# Patient Record
Sex: Male | Born: 1980
Health system: Southern US, Community
[De-identification: ages and names within clinical notes are randomized; demographics above are authoritative.]

## PROBLEM LIST (undated history)

## (undated) DIAGNOSIS — Z789 Other specified health status: Secondary | ICD-10-CM

## (undated) HISTORY — DX: Other specified health status: Z78.9

## (undated) HISTORY — PX: OTHER SURGICAL HISTORY: SHX169

---

## 2010-06-07 ENCOUNTER — Emergency Department (HOSPITAL_COMMUNITY): Admission: EM | Admit: 2010-06-07 | Discharge: 2010-06-07 | Payer: Self-pay | Admitting: Emergency Medicine

## 2015-01-30 ENCOUNTER — Emergency Department (HOSPITAL_COMMUNITY)
Admission: EM | Admit: 2015-01-30 | Discharge: 2015-01-30 | Disposition: A | Discharge status: Home / Self Care | Payer: Self-pay | Attending: Emergency Medicine | Admitting: Emergency Medicine

## 2015-01-30 ENCOUNTER — Encounter (HOSPITAL_COMMUNITY): Payer: Self-pay | Admitting: Emergency Medicine

## 2015-01-30 DIAGNOSIS — G510 Bell's palsy: Secondary | ICD-10-CM | POA: Insufficient documentation

## 2015-01-30 MED ORDER — VALACYCLOVIR HCL 500 MG PO TABS: 500.0000 mg | ORAL_TABLET | Freq: Two times a day (BID) | ORAL | Status: DC

## 2015-01-30 MED ORDER — PREDNISONE 10 MG PO TABS: ORAL_TABLET | ORAL | Status: DC

## 2015-01-30 NOTE — ED Notes (Signed)
Per patient, woke up this am and was brushing teeth and noticed right facial droop

## 2015-01-30 NOTE — Discharge Instructions (Signed)
Bell's Palsy °Bell's palsy is a condition in which the muscles on one side of the face cannot move (paralysis). This is because the nerves in the face are paralyzed. It is most often thought to be caused by a virus. The virus causes swelling of the nerve that controls movement on one side of the face. The nerve travels through a tight space surrounded by bone. When the nerve swells, it can be compressed by the bone. This results in damage to the protective covering around the nerve. This damage interferes with how the nerve communicates with the muscles of the face. As a result, it can cause weakness or paralysis of the facial muscles.  °Injury (trauma), tumor, and surgery may cause Bell's palsy, but most of the time the cause is unknown. It is a relatively common condition. It starts suddenly (abrupt onset) with the paralysis usually ending within 2 days. Bell's palsy is not dangerous. But because the eye does not close properly, you may need care to keep the eye from getting dry. This can include splinting (to keep the eye shut) or moistening with artificial tears. Bell's palsy very seldom occurs on both sides of the face at the same time. °SYMPTOMS  °· Eyebrow sagging. °· Drooping of the eyelid and corner of the mouth. °· Inability to close one eye. °· Loss of taste on the front of the tongue. °· Sensitivity to loud noises. °TREATMENT  °The treatment is usually non-surgical. If the patient is seen within the first 24 to 48 hours, a short course of steroids may be prescribed, in an attempt to shorten the length of the condition. Antiviral medicines may also be used with the steroids, but it is unclear if they are helpful.  °You will need to protect your eye, if you cannot close it. The cornea (clear covering over your eye) will become dry and can be damaged. Artificial tears can be used to keep your eye moist. Glasses or an eye patch should be worn to protect your eye. °PROGNOSIS  °Recovery is variable, ranging  from days to months. Although the problem usually goes away completely (about 80% of cases resolve), predicting the outcome is impossible. Most people improve within 3 weeks of when the symptoms began. Improvement may continue for 3 to 6 months. A small number of people have moderate to severe weakness that is permanent.  °HOME CARE INSTRUCTIONS  °· If your caregiver prescribed medication to reduce swelling in the nerve, use as directed. Do not stop taking the medication unless directed by your caregiver. °· Use moisturizing eye drops as needed to prevent drying of your eye, as directed by your caregiver. °· Protect your eye, as directed by your caregiver. °· Use facial massage and exercises, as directed by your caregiver. °· Perform your normal activities, and get your normal rest. °SEEK IMMEDIATE MEDICAL CARE IF:  °· There is pain, redness or irritation in the eye. °· You or your child has an oral temperature above 102° F (38.9° C), not controlled by medicine. °MAKE SURE YOU:  °· Understand these instructions. °· Will watch your condition. °· Will get help right away if you are not doing well or get worse. °Document Released: 09/07/2005 Document Revised: 11/30/2011 Document Reviewed: 12/15/2013 °ExitCare® Patient Information ©2015 [employer]. This information is not intended to replace advice given to you by your health care provider. Make sure you discuss any questions you have with your health care provider. ° °

## 2015-01-30 NOTE — ED Provider Notes (Signed)
CSN: [id number]     Arrival date & time 01/30/15  1000 History   First MD Initiated Contact with Patient 01/30/15 1010     Chief Complaint  Patient presents with  . Facial Droop     (Consider location/radiation/quality/duration/timing/severity/associated sxs/prior Treatment) HPI Comments: Patient presents to the ER for evaluation of right-sided facial drooping. Patient reports that he noticed drooping of the right side of his face this morning when he woke up. He does not have a headache or any pain. No arm or leg numbness, tingling or weakness.   History reviewed. No pertinent past medical history. History reviewed. No pertinent past surgical history. No family history on file. History  Substance Use Topics  . Smoking status: Never Smoker   . Smokeless tobacco: Not on file  . Alcohol Use: No    Review of Systems  Neurological: Positive for weakness (right facial).      Allergies  Review of patient's allergies indicates no known allergies.  Home Medications   Prior to Admission medications   Medication Sig Start Date End Date Taking? Authorizing Provider  predniSONE (DELTASONE) 10 MG tablet Take 3 tablets daily for 5 days, then take 2.5 tablets for 1 day, then take 2 tablets for 1 day, then take 1.5 tablets for 1 day, then take 1 tablet for 1 day, then take 0.5 tablet for 1 day, then stop 01/30/15   Orpah Greek, MD  valACYclovir (VALTREX) 500 MG tablet Take 1 tablet (500 mg total) by mouth 2 (two) times daily. 01/30/15   Orpah Greek, MD   BP 142/76 mmHg  Pulse 78  Temp(Src) 98.2 F (36.8 C) (Oral)  Resp 18  SpO2 100% Physical Exam  Constitutional: He is oriented to person, place, and time. He appears well-developed and well-nourished. No distress.  HENT:  Head: Normocephalic and atraumatic.  Right Ear: Hearing normal.  Left Ear: Hearing normal.  Nose: Nose normal.  Mouth/Throat: Oropharynx is clear and moist and mucous membranes are normal.   Eyes: Conjunctivae and EOM are normal. Pupils are equal, round, and reactive to light.  Neck: Normal range of motion. Neck supple.  Cardiovascular: Regular rhythm, S1 normal and S2 normal.  Exam reveals no gallop and no friction rub.   No murmur heard. Pulmonary/Chest: Effort normal and breath sounds normal. No respiratory distress. He exhibits no tenderness.  Abdominal: Soft. Normal appearance and bowel sounds are normal. There is no hepatosplenomegaly. There is no tenderness. There is no rebound, no guarding, no tenderness at McBurney's point and negative Murphy's sign. No hernia.  Musculoskeletal: Normal range of motion.  Neurological: He is alert and oriented to person, place, and time. He has normal strength. A cranial nerve deficit (Right facial nerve palsy, including upper motor neuron) is present. No sensory deficit. Coordination normal. GCS eye subscore is 4. GCS verbal subscore is 5. GCS motor subscore is 6.  Extraocular muscle movement: normal No visual field cut Pupils: equal and reactive both direct and consensual response is normal No nystagmus present    Sensory function is intact to light touch, pinprick Proprioception intact  Grip strength 5/5 symmetric in upper extremities No pronator drift Normal finger to nose bilaterally  Lower extremity strength 5/5 against gravity Normal heel to shin bilaterally  Gait: normal   Skin: Skin is warm, dry and intact. No rash noted. No cyanosis.  Psychiatric: He has a normal mood and affect. His speech is normal and behavior is normal. Thought content normal.  Nursing note and  vitals reviewed.   ED Course  Procedures (including critical care time) Labs Review Labs Reviewed - No data to display  Imaging Review No results found.   EKG Interpretation None      MDM   Final diagnoses:  Bell palsy    Patient presents to the ER for evaluation of a facial droop. Patient awakened this morning with right facial droop.  Examination is consistent with a facial nerve palsy. This includes upper and lower motor neuron, no concern for acute stroke. Examination is very consistent with Bell's palsy. Remainder of neurologic examination was unremarkable. Patient treated empirically for Bell's palsy.    Orpah Greek, MD 01/30/15 1036

## 2015-03-19 ENCOUNTER — Ambulatory Visit: Payer: Worker's Compensation

## 2015-03-19 ENCOUNTER — Ambulatory Visit (INDEPENDENT_AMBULATORY_CARE_PROVIDER_SITE_OTHER): Payer: Worker's Compensation | Admitting: Family Medicine

## 2015-03-19 VITALS — BP 116/86 | HR 70 | Temp 98.1°F | Resp 16 | Ht 72.0 in | Wt 218.0 lb

## 2015-03-19 DIAGNOSIS — S63602A Unspecified sprain of left thumb, initial encounter: Secondary | ICD-10-CM | POA: Diagnosis not present

## 2015-03-19 DIAGNOSIS — M79642 Pain in left hand: Secondary | ICD-10-CM | POA: Diagnosis not present

## 2015-03-19 NOTE — Patient Instructions (Signed)
Take ibuprofen 800 mg three times a day. Keep splint on for next 1 week. Return in 7 days for follow up. Light duty at work.

## 2015-03-19 NOTE — Progress Notes (Signed)
Frank Sullivan 17-Feb-1981 34 y.o.  Chief Complaint  Patient presents with  . Hand Pain    Left hand    Date of Injury: 03/19/15  History of Present Illness:  Presents for evaluation of work-related complaint. Drives a fork lift for work. States he was reaching for the shifter and misjudged where he was reaching. His thumb got caught on something in the vehicle and hyperextended. He had immediate shooting pain down radial side of wrist. States now he only has pain with movement and palpation - no pain at rest. He is noticing some swelling at the base of his thumb. Hasn't taken anything for the pain - did apply ice. No prior left hand/wrist/arm injury. He denies numbness or weakness.  ROS See HPI  No Known Allergies   Current medications reviewed and updated. Past medical history, family history, social history have been reviewed and updated.  Physical Exam  Constitutional: He is oriented to person, place, and time and well-developed, well-nourished, and in no distress. Vital signs are normal.  HENT:  Head: Normocephalic and atraumatic.  Eyes: Conjunctivae and lids are normal. Right eye exhibits no discharge. Left eye exhibits no discharge. No scleral icterus.  Cardiovascular: Normal rate, regular rhythm and normal pulses.   Pulmonary/Chest: Effort normal and breath sounds normal. No respiratory distress.  Musculoskeletal:       Right wrist: Normal.       Left wrist: Normal.       Right hand: Normal.       Left hand: He exhibits decreased range of motion (decreased abduction of thumb, otherwise full ROM of thumb), tenderness (base of 1st metacarpal. mild scaphoid tenderness) and swelling (thenar eminence). He exhibits normal capillary refill. Normal sensation noted. Decreased strength noted. He exhibits thumb/finger opposition. He exhibits no wrist extension trouble.  Neurological: He is oriented to person, place, and time.  Skin: Skin is warm, dry and intact. No lesion and no rash  noted.  Psychiatric: Mood, memory, affect and judgment normal.    [facility] reading (PRIMARY) by  Dr. Lorelei Pont: negative  Assessment and Plan:  1. Thumb sprain, left, initial encounter 2. Hand pain, left Radiograph negative. Pt was fit for thumb spica splint since some scaphoid tenderness present. He will wear for next 7 days. Counseled on ibuprofen, rest, ice, elevation. Light duty at work. Return in 7 days for recheck. - DG Wrist Complete Left; Future   Benjaman Pott. Drenda Freeze, [facility]  03/19/2015

## 2015-03-28 ENCOUNTER — Ambulatory Visit (INDEPENDENT_AMBULATORY_CARE_PROVIDER_SITE_OTHER): Payer: Worker's Compensation | Admitting: Physician Assistant

## 2015-03-28 VITALS — BP 122/82 | HR 83 | Temp 98.2°F | Resp 16 | Ht 72.0 in | Wt 218.6 lb

## 2015-03-28 DIAGNOSIS — S63602D Unspecified sprain of left thumb, subsequent encounter: Secondary | ICD-10-CM

## 2015-03-28 NOTE — Patient Instructions (Signed)
Continue to wear splint at work. You do not have to wear splint at home. Do exercise at home daily. If your pain stops, you don't need to wear splint at work. Return on 04/11/15 for follow up - will determine at that time if you need hand physical therapy.

## 2015-03-28 NOTE — Progress Notes (Signed)
Frank Sullivan 22-Jun-1981 34 y.o.   Chief Complaint  Patient presents with  . Follow-up    left hand     Date of Injury: 03/19/15  History of Present Illness:  Presents for evaluation of work-related complaint.  This is a 34 year old male who was seen here for a thumb injury on 03/19/15. Thumb was hyperextended while reached for fork lift shifter. Radiograph was negative. He was placed in thumb spica splint d/t some scaphoid tenderness. Today he is reporting he has been wearing splint at all times except during showers and sleep. Pain has improved however he noticed certain positions of the thumb exacerbates the thumb, esp forced extension. Swelling has gone down. He denies weakness or paresthesias.  ROS  See HPI  No Known Allergies  Current medications reviewed and updated. Past medical history, family history, social history have been reviewed and updated.  Physical Exam  Constitutional: He is oriented to person, place, and time and well-developed, well-nourished, and in no distress. No distress.  HENT:  Head: Normocephalic and atraumatic.  Eyes: Conjunctivae are normal. Right eye exhibits no discharge. Left eye exhibits no discharge. No scleral icterus.  Cardiovascular: Normal rate, regular rhythm and intact distal pulses.   Pulmonary/Chest: Effort normal. No respiratory distress.  Musculoskeletal:       Right hand: Normal.       Left hand: He exhibits decreased range of motion (85% abduction, compared to right. pain with forced extension at proximal 1st metacarpal), tenderness (Along 1st metacarpal) and bony tenderness. He exhibits normal capillary refill, no deformity, no laceration and no swelling. Normal sensation noted. Normal strength noted.  Neurological: He is alert and oriented to person, place, and time.  Skin: Skin is warm, dry and intact. No lesion and no rash noted.  Psychiatric: Mood, memory, affect and judgment normal.  Nursing note and vitals  reviewed.  Assessment and Plan:  1. Thumb sprain, left, subsequent encounter Swelling resolved. Still with some pain along 1st metacarpal. Pain with abduction and extension. He will continue to use splint while at work. At home he will take off splint and work on ROM and strength exercises. Return in 2 weeks for follow up. Will determine at that time if he needs referral to hand therapy.   Benjaman Pott Drenda Freeze, [facility]  03/28/2015

## 2015-04-09 ENCOUNTER — Ambulatory Visit (INDEPENDENT_AMBULATORY_CARE_PROVIDER_SITE_OTHER): Payer: Worker's Compensation | Admitting: Family Medicine

## 2015-04-09 VITALS — BP 120/80 | HR 77 | Temp 98.0°F | Resp 16 | Ht 72.0 in | Wt 220.0 lb

## 2015-04-09 DIAGNOSIS — IMO0001 Reserved for inherently not codable concepts without codable children: Secondary | ICD-10-CM

## 2015-04-09 DIAGNOSIS — S66912D Strain of unspecified muscle, fascia and tendon at wrist and hand level, left hand, subsequent encounter: Secondary | ICD-10-CM

## 2015-04-09 NOTE — Progress Notes (Signed)
  Subjective:  Patient ID: Frank Sullivan, male    DOB: May 27, 1981  Age: 34 y.o. MRN: [id number]  34 year old man who is being here couple of visits. He had injured his left thumb when it got pulled backwards. He had continued to have pain with extension of the thumb especially. Has continued to improve. He is doing his regular duty at work. He was wearing his splint until a few days ago and is working without it now. Is able to do okay without it though he still has pain. If he bumps it it hurts a great deal still.   Objective:   Grossly normal-appearing hand and thumb. Extension is a little less of his left thumb compared to the right. He has pain on forced extension or flexion in an apposition type motion.  Assessment & Plan:   Assessment: Left thumb pain and strain, slowly resolving  Plan:    Patient Instructions  Continue being careful with the thumb.  No longer needed to wear the splint unless it is hurting him more  Return at anytime if worse, otherwise get it rechecked one more time in about 3 weeks before final released from [place], MD 04/09/2015

## 2015-04-09 NOTE — Patient Instructions (Signed)
Continue being careful with the thumb.  No longer needed to wear the splint unless it is hurting him more  Return at anytime if worse, otherwise get it rechecked one more time in about 3 weeks before final released from [employer] claim

## 2015-04-30 ENCOUNTER — Encounter (HOSPITAL_COMMUNITY): Payer: Self-pay | Admitting: *Deleted

## 2015-04-30 ENCOUNTER — Emergency Department (HOSPITAL_COMMUNITY)
Admission: EM | Admit: 2015-04-30 | Discharge: 2015-04-30 | Disposition: A | Discharge status: Home / Self Care | Payer: Self-pay | Attending: Emergency Medicine | Admitting: Emergency Medicine

## 2015-04-30 DIAGNOSIS — K529 Noninfective gastroenteritis and colitis, unspecified: Secondary | ICD-10-CM | POA: Insufficient documentation

## 2015-04-30 DIAGNOSIS — Z720 Tobacco use: Secondary | ICD-10-CM | POA: Insufficient documentation

## 2015-04-30 DIAGNOSIS — R42 Dizziness and giddiness: Secondary | ICD-10-CM | POA: Insufficient documentation

## 2015-04-30 LAB — LIPASE, BLOOD: LIPASE: 26 U/L (ref 22–51)

## 2015-04-30 LAB — URINALYSIS, ROUTINE W REFLEX MICROSCOPIC
Bilirubin Urine: NEGATIVE
Glucose, UA: NEGATIVE mg/dL
Hgb urine dipstick: NEGATIVE
Ketones, ur: NEGATIVE mg/dL
Leukocytes, UA: NEGATIVE
NITRITE: NEGATIVE
Protein, ur: NEGATIVE mg/dL
SPECIFIC GRAVITY, URINE: 1.031 — AB (ref 1.005–1.030)
UROBILINOGEN UA: 0.2 mg/dL (ref 0.0–1.0)
pH: 5.5 (ref 5.0–8.0)

## 2015-04-30 LAB — CBC
HEMATOCRIT: 46.4 % (ref 39.0–52.0)
Hemoglobin: 16 g/dL (ref 13.0–17.0)
MCH: 30.9 pg (ref 26.0–34.0)
MCHC: 34.5 g/dL (ref 30.0–36.0)
MCV: 89.7 fL (ref 78.0–100.0)
Platelets: 245 10*3/uL (ref 150–400)
RBC: 5.17 MIL/uL (ref 4.22–5.81)
RDW: 12.9 % (ref 11.5–15.5)
WBC: 15.3 10*3/uL — ABNORMAL HIGH (ref 4.0–10.5)

## 2015-04-30 LAB — COMPREHENSIVE METABOLIC PANEL
ALT: 12 U/L — ABNORMAL LOW (ref 17–63)
ANION GAP: 9 (ref 5–15)
AST: 23 U/L (ref 15–41)
Albumin: 4.7 g/dL (ref 3.5–5.0)
Alkaline Phosphatase: 66 U/L (ref 38–126)
BUN: 21 mg/dL — AB (ref 6–20)
CHLORIDE: 102 mmol/L (ref 101–111)
CO2: 27 mmol/L (ref 22–32)
Calcium: 9.5 mg/dL (ref 8.9–10.3)
Creatinine, Ser: 1.27 mg/dL — ABNORMAL HIGH (ref 0.61–1.24)
GFR calc Af Amer: >60 mL/min (ref >60)
Glucose, Bld: 144 mg/dL — ABNORMAL HIGH (ref 65–99)
Potassium: 4 mmol/L (ref 3.5–5.1)
Sodium: 138 mmol/L (ref 135–145)
Total Bilirubin: 0.8 mg/dL (ref 0.3–1.2)
Total Protein: 8.9 g/dL — ABNORMAL HIGH (ref 6.5–8.1)

## 2015-04-30 MED ORDER — SODIUM CHLORIDE 0.9 % IV BOLUS (SEPSIS)
1000.0000 mL | Freq: Once | INTRAVENOUS | Status: AC
  Administered 2015-04-30: 1000 mL via INTRAVENOUS

## 2015-04-30 MED ORDER — ONDANSETRON 8 MG PO TBDP: 8.0000 mg | ORAL_TABLET | Freq: Three times a day (TID) | ORAL | Status: DC | PRN

## 2015-04-30 MED ORDER — ONDANSETRON HCL 4 MG/2ML IJ SOLN
4.0000 mg | Freq: Once | INTRAMUSCULAR | Status: AC
Start: 2015-04-30 — End: 2015-04-30
  Administered 2015-04-30: 4 mg via INTRAVENOUS
  Filled 2015-04-30: qty 2

## 2015-04-30 NOTE — ED Notes (Signed)
Pt states that he woke up around 11pm with vomiting and diarrhea; pt states that he has had 4-5 episodes of vomiting since 11pm; pt denies abd pain but c/o cramping right before vomiting or diarrhea

## 2015-04-30 NOTE — ED Notes (Signed)
Bed: WA08 Expected date:  Expected time:  Means of arrival:  Comments: 

## 2015-04-30 NOTE — ED Provider Notes (Signed)
CSN: [id number]   Arrival date & time 04/30/15 0148  History  This chart was scribed for  Shanon Rosser, MD by Altamease Oiler, ED Scribe. This patient was seen in room WA08/WA08 and the patient's care was started at 3:46 AM.  Chief Complaint  Patient presents with  . N/V/D     HPI The history is provided by the patient. No language interpreter was used.   Frank Sullivan is a 34 y.o. male who presents to the Emergency Department complaining of N/V/D with sudden onset around 2 PM last night. Pt was sleeping when he woke up to have an episode of vomiting and diarrhea. He has had 3-4 repeated episodes. At present pt has improving nausea, increased thirst, and intermittent dizziness. He has not taken anything for his symptoms. No fever, abdominal pain, hematemesis or hematochezia.   History reviewed. No pertinent past medical history.  History reviewed. No pertinent past surgical history.  Family History  Problem Relation Age of Onset  . Stroke Maternal Grandmother     History  Substance Use Topics  . Smoking status: Light Tobacco Smoker    Types: Cigars  . Smokeless tobacco: Not on file  . Alcohol Use: 0.0 oz/week    0 Standard drinks or equivalent per week     Comment: on weekends     Review of Systems 10 Systems reviewed and all are negative for acute change except as noted in the HPI.  Home Medications   Prior to Admission medications   Medication Sig Start Date End Date Taking? Authorizing Provider  valACYclovir (VALTREX) 500 MG tablet Take 1 tablet (500 mg total) by mouth 2 (two) times daily. Patient not taking: Reported on 03/19/2015 01/30/15   Orpah Greek, MD    Allergies  Review of patient's allergies indicates no known allergies.  Triage Vitals: BP 115/81 mmHg  Pulse 87  Temp(Src) 97.6 F (36.4 C) (Oral)  Resp 18  Ht 6' (1.829 m)  Wt 220 lb (99.791 kg)  BMI 29.83 kg/m2  SpO2 96%  Physical Exam  Nursing note and vitals reviewed.  General:  Well-developed, well-nourished male in no acute distress; appearance consistent with age of record HENT: normocephalic; atraumatic Eyes: pupils equal, round and reactive to light; extraocular muscles intact Neck: supple Heart: regular rate and rhythm Lungs: clear to auscultation bilaterally Abdomen: soft; nondistended; nontender; no masses or hepatosplenomegaly; bowel sounds present Extremities: No deformity; full range of motion; pulses normal Neurologic: Awake, alert and oriented; motor function intact in all extremities and symmetric; no facial droop Skin: Warm and dry Psychiatric: Normal mood and affect  ED Course  Procedures   DIAGNOSTIC STUDIES: Oxygen Saturation is 96% on RA, normal by my interpretation.    COORDINATION OF CARE: 3:49 AM Discussed treatment plan which includes lab work, Zofran, and IVF with pt at bedside and pt agreed to plan.    MDM  5:02 AM Drinking fluids without emesis after IV fluids and Zofran.  Nursing notes and vitals signs, including pulse oximetry, reviewed.  Summary of this visit's results, reviewed by myself:  Labs:  Results for orders placed or performed during the hospital encounter of 04/30/15 (from the past 24 hour(s))  Lipase, blood     Status: None   Collection Time: 04/30/15  2:35 AM  Result Value Ref Range   Lipase 26 22 - 51 U/L  Comprehensive metabolic panel     Status: Abnormal   Collection Time: 04/30/15  2:35 AM  Result Value Ref Range  Sodium 138 135 - 145 mmol/L   Potassium 4.0 3.5 - 5.1 mmol/L   Chloride 102 101 - 111 mmol/L   CO2 27 22 - 32 mmol/L   Glucose, Bld 144 (H) 65 - 99 mg/dL   BUN 21 (H) 6 - 20 mg/dL   Creatinine, Ser 1.27 (H) 0.61 - 1.24 mg/dL   Calcium 9.5 8.9 - 10.3 mg/dL   Total Protein 8.9 (H) 6.5 - 8.1 g/dL   Albumin 4.7 3.5 - 5.0 g/dL   AST 23 15 - 41 U/L   ALT 12 (L) 17 - 63 U/L   Alkaline Phosphatase 66 38 - 126 U/L   Total Bilirubin 0.8 0.3 - 1.2 mg/dL   GFR calc non Af Amer >60 >60 mL/min    GFR calc Af Amer >60 >60 mL/min   Anion gap 9 5 - 15  CBC     Status: Abnormal   Collection Time: 04/30/15  2:35 AM  Result Value Ref Range   WBC 15.3 (H) 4.0 - 10.5 K/uL   RBC 5.17 4.22 - 5.81 MIL/uL   Hemoglobin 16.0 13.0 - 17.0 g/dL   HCT 46.4 39.0 - 52.0 %   MCV 89.7 78.0 - 100.0 fL   MCH 30.9 26.0 - 34.0 pg   MCHC 34.5 30.0 - 36.0 g/dL   RDW 12.9 11.5 - 15.5 %   Platelets 245 150 - 400 K/uL  Urinalysis, Routine w reflex microscopic (not at [facility])     Status: Abnormal   Collection Time: 04/30/15  2:37 AM  Result Value Ref Range   Color, Urine YELLOW YELLOW   APPearance CLOUDY (A) CLEAR   Specific Gravity, Urine 1.031 (H) 1.005 - 1.030   pH 5.5 5.0 - 8.0   Glucose, UA NEGATIVE NEGATIVE mg/dL   Hgb urine dipstick NEGATIVE NEGATIVE   Bilirubin Urine NEGATIVE NEGATIVE   Ketones, ur NEGATIVE NEGATIVE mg/dL   Protein, ur NEGATIVE NEGATIVE mg/dL   Urobilinogen, UA 0.2 0.0 - 1.0 mg/dL   Nitrite NEGATIVE NEGATIVE   Leukocytes, UA NEGATIVE NEGATIVE    I personally performed the services described in this documentation, which was scribed in my presence. The recorded information has been reviewed and is accurate.   Shanon Rosser, MD 04/30/15 [phone]

## 2016-11-24 ENCOUNTER — Encounter (HOSPITAL_BASED_OUTPATIENT_CLINIC_OR_DEPARTMENT_OTHER): Payer: Self-pay

## 2016-11-24 ENCOUNTER — Emergency Department (HOSPITAL_BASED_OUTPATIENT_CLINIC_OR_DEPARTMENT_OTHER)
Admission: EM | Admit: 2016-11-24 | Discharge: 2016-11-24 | Disposition: A | Discharge status: Home / Self Care | Payer: BLUE CROSS/BLUE SHIELD | Attending: Emergency Medicine | Admitting: Emergency Medicine

## 2016-11-24 ENCOUNTER — Emergency Department (HOSPITAL_BASED_OUTPATIENT_CLINIC_OR_DEPARTMENT_OTHER): Payer: BLUE CROSS/BLUE SHIELD

## 2016-11-24 DIAGNOSIS — X58XXXA Exposure to other specified factors, initial encounter: Secondary | ICD-10-CM | POA: Insufficient documentation

## 2016-11-24 DIAGNOSIS — M79641 Pain in right hand: Secondary | ICD-10-CM | POA: Diagnosis not present

## 2016-11-24 DIAGNOSIS — Z87891 Personal history of nicotine dependence: Secondary | ICD-10-CM | POA: Insufficient documentation

## 2016-11-24 DIAGNOSIS — Y9367 Activity, basketball: Secondary | ICD-10-CM | POA: Insufficient documentation

## 2016-11-24 DIAGNOSIS — Y999 Unspecified external cause status: Secondary | ICD-10-CM | POA: Diagnosis not present

## 2016-11-24 DIAGNOSIS — S6991XA Unspecified injury of right wrist, hand and finger(s), initial encounter: Secondary | ICD-10-CM | POA: Diagnosis present

## 2016-11-24 DIAGNOSIS — Y929 Unspecified place or not applicable: Secondary | ICD-10-CM | POA: Diagnosis not present

## 2016-11-24 DIAGNOSIS — T1490XA Injury, unspecified, initial encounter: Secondary | ICD-10-CM

## 2016-11-24 NOTE — ED Triage Notes (Signed)
C/o right hand injury playing basketball x "mont ago"-NAD-steady gait

## 2016-11-24 NOTE — ED Provider Notes (Signed)
[facility] DEPT [facility] Provider Note   CSN: [id number] Arrival date & time: 11/24/16  1612     History   Chief Complaint Chief Complaint  Patient presents with  . Hand Injury    HPI Frank Sullivan is a 36 y.o. male.  The history is provided by the patient. No language interpreter was used.  Hand Injury     Frank Sullivan is a 36 y.o. male who presents to the Emergency Department complaining of hand injury.  He hurt his right hand when a month ago while playing basketball. He does not remember the mechanism but since that time he has experienced pain in his right hand. He has difficulty gripping with the right hand as well as turning keys. He has significant pain to his second metacarpal whenever his hand is hit on object. No numbness, fevers, systemic symptoms. He is right-handed. History reviewed. No pertinent past medical history.  There are no active problems to display for this patient.   History reviewed. No pertinent surgical history.     Home Medications    Prior to Admission medications   Not on File    Family History Family History  Problem Relation Age of Onset  . Stroke Maternal Grandmother     Social History Social History  Substance Use Topics  . Smoking status: Former Smoker    Types: Cigars  . Smokeless tobacco: Never Used  . Alcohol use 0.0 oz/week     Comment: weekly     Allergies   Patient has no known allergies.   Review of Systems Review of Systems  All other systems reviewed and are negative.    Physical Exam Updated Vital Signs BP 141/98 (BP Location: Left Arm)   Pulse 88   Temp 98.4 F (36.9 C) (Oral)   Resp 22   Ht 6' (1.829 m)   Wt 230 lb (104.3 kg)   SpO2 96%   BMI 31.19 kg/m   Physical Exam  Constitutional: He is oriented to person, place, and time. He appears well-developed and well-nourished.  HENT:  Head: Normocephalic and atraumatic.  Cardiovascular: Normal rate.   Pulmonary/Chest: Effort normal. No  respiratory distress.  Musculoskeletal:  2+ radial pulses bilaterally. There is mild tenderness over the right second proximal phalanx as well as the second MCP joints. Flexion and extension is intact throughout all digits. There is mild weakness on apposition of the thumb and second phalanx. Sensation to light touch intact throughout both hands with symmetric grip strength bilaterally. There is no snuffbox tenderness. Flexion extension is intact throughout the wrist.  Neurological: He is alert and oriented to person, place, and time.  Skin: Skin is warm and dry.  Psychiatric: He has a normal mood and affect. His behavior is normal.  Nursing note and vitals reviewed.    ED Treatments / Results  Labs (all labs ordered are listed, but only abnormal results are displayed) Labs Reviewed - No data to display  EKG  EKG Interpretation None       Radiology No results found.  Procedures Procedures (including critical care time)  Medications Ordered in ED Medications - No data to display   Initial Impression / Assessment and Plan / ED Course  I have reviewed the triage vital signs and the nursing notes.  Pertinent labs & imaging results that were available during my care of the patient were reviewed by me and considered in my medical decision making (see chart for details).     Patient here for  1 month of intermittent pain in his hand and difficulty with gripping. There is no evidence of acute fracture or acute infectious or inflammatory process at this time. Counseled patient on importance of outpatient follow-up for further evaluation. Will provide removable splint to be used as needed at work for comfort.  Final Clinical Impressions(s) / ED Diagnoses   Final diagnoses:  Right hand pain    New Prescriptions New Prescriptions   No medications on file     Quintella Reichert, MD 11/24/16 1649

## 2016-11-24 NOTE — ED Notes (Signed)
Patient transported to X-ray 

## 2020-11-20 DIAGNOSIS — Z20822 Contact with and (suspected) exposure to covid-19: Secondary | ICD-10-CM | POA: Diagnosis not present

## 2020-11-20 DIAGNOSIS — Z03818 Encounter for observation for suspected exposure to other biological agents ruled out: Secondary | ICD-10-CM | POA: Diagnosis not present

## 2021-04-08 ENCOUNTER — Ambulatory Visit (INDEPENDENT_AMBULATORY_CARE_PROVIDER_SITE_OTHER): Payer: BC Managed Care – PPO | Admitting: Internal Medicine

## 2021-04-08 ENCOUNTER — Encounter: Payer: Self-pay | Admitting: Internal Medicine

## 2021-04-08 ENCOUNTER — Other Ambulatory Visit: Payer: Self-pay

## 2021-04-08 VITALS — BP 130/80 | HR 66 | Temp 98.2°F | Ht 73.0 in | Wt 248.0 lb

## 2021-04-08 DIAGNOSIS — Z1159 Encounter for screening for other viral diseases: Secondary | ICD-10-CM | POA: Insufficient documentation

## 2021-04-08 DIAGNOSIS — R5382 Chronic fatigue, unspecified: Secondary | ICD-10-CM | POA: Diagnosis not present

## 2021-04-08 DIAGNOSIS — Z0001 Encounter for general adult medical examination with abnormal findings: Secondary | ICD-10-CM | POA: Insufficient documentation

## 2021-04-08 DIAGNOSIS — G4726 Circadian rhythm sleep disorder, shift work type: Secondary | ICD-10-CM | POA: Diagnosis not present

## 2021-04-08 DIAGNOSIS — R635 Abnormal weight gain: Secondary | ICD-10-CM | POA: Insufficient documentation

## 2021-04-08 LAB — CBC WITH DIFFERENTIAL/PLATELET
Basophils Absolute: 0 10*3/uL (ref 0.0–0.1)
Basophils Relative: 0.3 % (ref 0.0–3.0)
Eosinophils Absolute: 0.1 10*3/uL (ref 0.0–0.7)
Eosinophils Relative: 2.8 % (ref 0.0–5.0)
HCT: 42.6 % (ref 39.0–52.0)
Hemoglobin: 14.8 g/dL (ref 13.0–17.0)
Lymphocytes Relative: 45.8 % (ref 12.0–46.0)
Lymphs Abs: 2.3 10*3/uL (ref 0.7–4.0)
MCHC: 34.7 g/dL (ref 30.0–36.0)
MCV: 90.4 fl (ref 78.0–100.0)
Monocytes Absolute: 0.5 10*3/uL (ref 0.1–1.0)
Monocytes Relative: 9.8 % (ref 3.0–12.0)
Neutro Abs: 2.1 10*3/uL (ref 1.4–7.7)
Neutrophils Relative %: 41.3 % — ABNORMAL LOW (ref 43.0–77.0)
Platelets: 229 10*3/uL (ref 150.0–400.0)
RBC: 4.72 Mil/uL (ref 4.22–5.81)
RDW: 13.6 % (ref 11.5–15.5)
WBC: 5.1 10*3/uL (ref 4.0–10.5)

## 2021-04-08 LAB — BASIC METABOLIC PANEL
BUN: 11 mg/dL (ref 6–23)
CO2: 28 mEq/L (ref 19–32)
Calcium: 9.1 mg/dL (ref 8.4–10.5)
Chloride: 104 mEq/L (ref 96–112)
Creatinine, Ser: 0.99 mg/dL (ref 0.40–1.50)
GFR: 96.69 mL/min (ref >60.00)
Glucose, Bld: 89 mg/dL (ref 70–99)
Potassium: 3.9 mEq/L (ref 3.5–5.1)
Sodium: 139 mEq/L (ref 135–145)

## 2021-04-08 LAB — LIPID PANEL
Cholesterol: 198 mg/dL (ref 0–200)
HDL: 30.2 mg/dL — ABNORMAL LOW (ref >39.00)
LDL Cholesterol: 131 mg/dL — ABNORMAL HIGH (ref 0–99)
NonHDL: 167.89
Total CHOL/HDL Ratio: 7
Triglycerides: 182 mg/dL — ABNORMAL HIGH (ref 0.0–149.0)
VLDL: 36.4 mg/dL (ref 0.0–40.0)

## 2021-04-08 LAB — HEPATIC FUNCTION PANEL
ALT: 11 U/L (ref 0–53)
AST: 21 U/L (ref 0–37)
Albumin: 4.2 g/dL (ref 3.5–5.2)
Alkaline Phosphatase: 49 U/L (ref 39–117)
Bilirubin, Direct: 0.1 mg/dL (ref 0.0–0.3)
Total Bilirubin: 0.4 mg/dL (ref 0.2–1.2)
Total Protein: 7.6 g/dL (ref 6.0–8.3)

## 2021-04-08 LAB — TSH: TSH: 2.7 u[IU]/mL (ref 0.35–5.50)

## 2021-04-08 NOTE — Patient Instructions (Signed)
Health Maintenance, Male Adopting a healthy lifestyle and getting preventive care are important in promoting health and wellness. Ask your health care provider about: The right schedule for you to have regular tests and exams. Things you can do on your own to prevent diseases and keep yourself healthy. What should I know about diet, weight, and exercise? Eat a healthy diet  Eat a diet that includes plenty of vegetables, fruits, low-fat dairy products, and lean protein. Do not eat a lot of foods that are high in solid fats, added sugars, or sodium.  Maintain a healthy weight Body mass index (BMI) is a measurement that can be used to identify possible weight problems. It estimates body fat based on height and weight. Your health care provider can help determine your BMI and help you achieve or maintain ahealthy weight. Get regular exercise Get regular exercise. This is one of the most important things you can do for your health. Most adults should: Exercise for at least 150 minutes each week. The exercise should increase your heart rate and make you sweat (moderate-intensity exercise). Do strengthening exercises at least twice a week. This is in addition to the moderate-intensity exercise. Spend less time sitting. Even light physical activity can be beneficial. Watch cholesterol and blood lipids Have your blood tested for lipids and cholesterol at 38 years of age, then havethis test every 5 years. You may need to have your cholesterol levels checked more often if: Your lipid or cholesterol levels are high. You are older than 38 years of age. You are at high risk for heart disease. What should I know about cancer screening? Many types of cancers can be detected early and may often be prevented. Depending on your health history and family history, you may need to have cancer screening at various ages. This may include screening for: Colorectal cancer. Prostate cancer. Skin cancer. Lung  cancer. What should I know about heart disease, diabetes, and high blood pressure? Blood pressure and heart disease High blood pressure causes heart disease and increases the risk of stroke. This is more likely to develop in people who have high blood pressure readings, are of African descent, or are overweight. Talk with your health care provider about your target blood pressure readings. Have your blood pressure checked: Every 3-5 years if you are 18-39 years of age. Every year if you are 40 years old or older. If you are between the ages of 65 and 75 and are a current or former smoker, ask your health care provider if you should have a one-time screening for abdominal aortic aneurysm (AAA). Diabetes Have regular diabetes screenings. This checks your fasting blood sugar level. Have the screening done: Once every three years after age 45 if you are at a normal weight and have a low risk for diabetes. More often and at a younger age if you are overweight or have a high risk for diabetes. What should I know about preventing infection? Hepatitis B If you have a higher risk for hepatitis B, you should be screened for this virus. Talk with your health care provider to find out if you are at risk forhepatitis B infection. Hepatitis C Blood testing is recommended for: Everyone born from 1945 through 1965. Anyone with known risk factors for hepatitis C. Sexually transmitted infections (STIs) You should be screened each year for STIs, including gonorrhea and chlamydia, if: You are sexually active and are younger than 38 years of age. You are older than 38 years of age   and your health care provider tells you that you are at risk for this type of infection. Your sexual activity has changed since you were last screened, and you are at increased risk for chlamydia or gonorrhea. Ask your health care provider if you are at risk. Ask your health care provider about whether you are at high risk for HIV.  Your health care provider may recommend a prescription medicine to help prevent HIV infection. If you choose to take medicine to prevent HIV, you should first get tested for HIV. You should then be tested every 3 months for as long as you are taking the medicine. Follow these instructions at home: Lifestyle Do not use any products that contain nicotine or tobacco, such as cigarettes, e-cigarettes, and chewing tobacco. If you need help quitting, ask your health care provider. Do not use street drugs. Do not share needles. Ask your health care provider for help if you need support or information about quitting drugs. Alcohol use Do not drink alcohol if your health care provider tells you not to drink. If you drink alcohol: Limit how much you have to 0-2 drinks a day. Be aware of how much alcohol is in your drink. In the [place] one drink equals one 12 oz bottle of beer (355 mL), one 5 oz glass of wine (148 mL), or one 1 oz glass of hard liquor (44 mL). General instructions Schedule regular health, dental, and eye exams. Stay current with your vaccines. Tell your health care provider if: You often feel depressed. You have ever been abused or do not feel safe at home. Summary Adopting a healthy lifestyle and getting preventive care are important in promoting health and wellness. Follow your health care provider's instructions about healthy diet, exercising, and getting tested or screened for diseases. Follow your health care provider's instructions on monitoring your cholesterol and blood pressure. This information is not intended to replace advice given to you by your health care provider. Make sure you discuss any questions you have with your healthcare provider. Document Revised: 08/31/2018 Document Reviewed: 08/31/2018 Elsevier Patient Education  2022 [employer]  

## 2021-04-08 NOTE — Progress Notes (Signed)
Subjective:  Patient ID: Frank [facility], male    DOB: 24-May-1981  Age: 40 y.o. MRN: [id number]  CC: New Patient (Initial Visit)  This visit occurred during the SARS-CoV-2 public health emergency.  Safety protocols were in place, including screening questions prior to the visit, additional usage of staff PPE, and extensive cleaning of exam room while observing appropriate contact time as indicated for disinfecting solutions.    HPI [facility] presents for a CPX and to establish.   Frank Sullivan works second shift and tells me this interferes with his sleep.  Frank Sullivan has early morning awakening and frequent awakenings.  Frank Sullivan tells me Frank Sullivan is getting adequate symptom relief with melatonin.  Frank Sullivan also complains of a several month history of fatigue and weight gain.  Frank Sullivan denies snoring or sleep apnea.  History Frank Sullivan has no past medical history on file.   Frank [facility] has no past surgical history on file.   His family history includes Stroke in his maternal grandmother and maternal uncle.Frank Sullivan reports that Frank Sullivan has quit smoking. His smoking use included cigars. Frank Sullivan has never used smokeless tobacco. Frank Sullivan reports current alcohol use of about 2.0 standard drinks of alcohol per week. Frank Sullivan reports that Frank Sullivan does not use drugs.  No outpatient medications prior to visit.   No facility-administered medications prior to visit.    ROS Review of Systems  Constitutional:  Positive for fatigue and unexpected weight change.  HENT: Negative.    Eyes: Negative.   Respiratory:  Negative for apnea, cough, shortness of breath and wheezing.   Cardiovascular:  Negative for chest pain, palpitations and leg swelling.  Gastrointestinal:  Negative for abdominal pain, diarrhea and nausea.  Endocrine: Negative.   Genitourinary:  Negative for scrotal swelling and testicular pain.  Musculoskeletal: Negative.   Skin: Negative.   Allergic/Immunologic: Negative.   Neurological: Negative.  Negative for headaches.  Hematological:  Negative for adenopathy.  Does not bruise/bleed easily.  Psychiatric/Behavioral:  Positive for sleep disturbance. Negative for decreased concentration. The patient is not nervous/anxious.    Objective:  BP 130/80 (BP Location: Left Arm, Patient Position: Sitting, Cuff Size: Large)   Pulse 66   Temp 98.2 F (36.8 C) (Oral)   Ht 6\' 1"  (1.854 m)   Wt 248 lb (112.5 kg)   SpO2 96%   BMI 32.72 kg/m   Physical Exam Vitals reviewed.  HENT:     Nose: Nose normal.     Mouth/Throat:     Mouth: Mucous membranes are moist.  Eyes:     General: No scleral icterus.    Conjunctiva/sclera: Conjunctivae normal.  Cardiovascular:     Rate and Rhythm: Normal rate and regular rhythm.     Heart sounds: No murmur heard. Pulmonary:     Effort: Pulmonary effort is normal.     Breath sounds: No stridor. No wheezing, rhonchi or rales.  Abdominal:     General: Abdomen is protuberant. Bowel sounds are normal. There is no distension.     Palpations: Abdomen is soft. There is no hepatomegaly, splenomegaly or mass.     Tenderness: There is no abdominal tenderness. There is no guarding.  Musculoskeletal:        General: Normal range of motion.     Cervical back: Neck supple.     Right lower leg: No edema.     Left lower leg: No edema.  Lymphadenopathy:     Cervical: No cervical adenopathy.  Skin:    General: Skin is warm and dry.  Capillary Refill: Capillary refill takes less than 2 seconds.  Neurological:     General: No focal deficit present.     Mental Status: Frank Sullivan is alert.  Psychiatric:        Mood and Affect: Mood normal.        Behavior: Behavior normal.    Lab Results  Component Value Date   WBC 5.1 04/08/2021   HGB 14.8 04/08/2021   HCT 42.6 04/08/2021   PLT 229.0 04/08/2021   GLUCOSE 89 04/08/2021   CHOL 198 04/08/2021   TRIG 182.0 (H) 04/08/2021   HDL 30.20 (L) 04/08/2021   LDLCALC 131 (H) 04/08/2021   ALT 11 04/08/2021   AST 21 04/08/2021   NA 139 04/08/2021   K 3.9 04/08/2021   CL 104 04/08/2021    CREATININE 0.99 04/08/2021   BUN 11 04/08/2021   CO2 28 04/08/2021   TSH 2.70 04/08/2021     Assessment & Plan:   Frank Sullivan was seen today for new patient (initial visit).  Diagnoses and all orders for this visit:  Encounter for general adult medical examination with abnormal findings- Exam completed, labs reviewed, Frank Sullivan refused a Tdap, no cancer screenings indicated, patient education material was given. -     Lipid panel; Future -     Hepatitis C antibody; Future -     HIV Antibody (routine testing w rflx); Future -     HIV Antibody (routine testing w rflx) -     Hepatitis C antibody -     Lipid panel  Shift work sleep disorder- Frank Sullivan does not want to take a prescription medication for this. -     CBC with Differential/Platelet; Future -     Basic metabolic panel; Future -     TSH; Future -     Hepatic function panel; Future -     Hepatic function panel -     TSH -     Basic metabolic panel -     CBC with Differential/Platelet  Chronic fatigue- Labs are negative for secondary causes.  This is likely related to the sleep disorder. -     CBC with Differential/Platelet; Future -     Basic metabolic panel; Future -     TSH; Future -     Hepatic function panel; Future -     Hepatic function panel -     TSH -     Basic metabolic panel -     CBC with Differential/Platelet  Abnormal weight gain- Labs are negative for secondary causes.  I encouraged him to improve his lifestyle modifications. -     CBC with Differential/Platelet; Future -     Basic metabolic panel; Future -     TSH; Future -     Hepatic function panel; Future -     Hepatic function panel -     TSH -     Basic metabolic panel -     CBC with Differential/Platelet  Need for hepatitis C screening test -     Hepatitis C antibody; Future -     Hepatitis C antibody  Frank Sullivan does not currently have medications on file.  No orders of the defined types were placed in this encounter.    Follow-up: Return in  about 6 months (around 10/09/2021).  Scarlette Calico, MD

## 2021-04-09 ENCOUNTER — Encounter: Payer: Self-pay | Admitting: Internal Medicine

## 2021-04-09 LAB — HEPATITIS C ANTIBODY
Hepatitis C Ab: NONREACTIVE
SIGNAL TO CUT-OFF: 0.04 (ref <1.00)

## 2021-04-09 LAB — HIV ANTIBODY (ROUTINE TESTING W REFLEX): HIV 1&2 Ab, 4th Generation: NONREACTIVE

## 2021-05-23 DIAGNOSIS — Z20822 Contact with and (suspected) exposure to covid-19: Secondary | ICD-10-CM | POA: Diagnosis not present

## 2021-08-01 DIAGNOSIS — R509 Fever, unspecified: Secondary | ICD-10-CM | POA: Diagnosis not present

## 2021-08-01 DIAGNOSIS — Z20822 Contact with and (suspected) exposure to covid-19: Secondary | ICD-10-CM | POA: Diagnosis not present

## 2021-08-01 DIAGNOSIS — J111 Influenza due to unidentified influenza virus with other respiratory manifestations: Secondary | ICD-10-CM | POA: Diagnosis not present

## 2021-08-06 ENCOUNTER — Other Ambulatory Visit: Payer: Self-pay

## 2021-08-06 ENCOUNTER — Encounter (HOSPITAL_BASED_OUTPATIENT_CLINIC_OR_DEPARTMENT_OTHER): Payer: Self-pay | Admitting: Emergency Medicine

## 2021-08-06 ENCOUNTER — Emergency Department (HOSPITAL_BASED_OUTPATIENT_CLINIC_OR_DEPARTMENT_OTHER): Payer: BC Managed Care – PPO | Admitting: Radiology

## 2021-08-06 ENCOUNTER — Emergency Department (HOSPITAL_BASED_OUTPATIENT_CLINIC_OR_DEPARTMENT_OTHER)
Admission: EM | Admit: 2021-08-06 | Discharge: 2021-08-06 | Disposition: A | Discharge status: Home / Self Care | Payer: BC Managed Care – PPO | Attending: Emergency Medicine | Admitting: Emergency Medicine

## 2021-08-06 DIAGNOSIS — R059 Cough, unspecified: Secondary | ICD-10-CM | POA: Diagnosis not present

## 2021-08-06 DIAGNOSIS — R051 Acute cough: Secondary | ICD-10-CM

## 2021-08-06 DIAGNOSIS — Z87891 Personal history of nicotine dependence: Secondary | ICD-10-CM | POA: Diagnosis not present

## 2021-08-06 NOTE — ED Triage Notes (Signed)
Pt presents to ED POV. Pt c/o flu+ on 11/11. Pt reports that his daughter was just here and seen for flu as well but cxr showed pnuemonia so pt is concerned same for him. Resp e/u, productive cough.

## 2021-08-06 NOTE — ED Provider Notes (Signed)
[facility] Provider Note   CSN: [id number] Arrival date & time: 08/06/21  2106     History Chief Complaint  Patient presents with   Influenza    Ashford Clouse is a 40 y.o. male.  40 year old male presents emerged from today with persistent cough after have been diagnosed with influenza last week.  Patient states his daughter was diagnosed pneumonia and secondary to his persistent cough he wanted to be checked for the same.  He states that he is not having productive cough.  His fevers been gone for couple days and bodies to be gone for couple days.  He is overall back to normal still has some night sweats intermittently.  But overall feels like he is improving.  He is not on any immune suppressant drugs, chemotherapy or have any immune suppressant diseases.   Influenza     History reviewed. No pertinent past medical history.  Patient Active Problem List   Diagnosis Date Noted   Shift work sleep disorder 04/08/2021   Encounter for general adult medical examination with abnormal findings 04/08/2021   Abnormal weight gain 04/08/2021   Chronic fatigue 04/08/2021   Need for hepatitis C screening test 04/08/2021    History reviewed. No pertinent surgical history.     Family History  Problem Relation Age of Onset   Stroke Maternal Grandmother    Stroke Maternal Uncle     Social History   Tobacco Use   Smoking status: Former    Types: Cigars   Smokeless tobacco: Never  Vaping Use   Vaping Use: Never used  Substance Use Topics   Alcohol use: Yes    Alcohol/week: 2.0 standard drinks    Types: 2 Standard drinks or equivalent per week    Comment: weekly   Drug use: Never    Home Medications Prior to Admission medications   Not on File    Allergies    Patient has no known allergies.  Review of Systems   Review of Systems  All other systems reviewed and are negative.  Physical Exam Updated Vital Signs BP (!) 144/86   Pulse 85    Temp 98.8 F (37.1 C)   Resp 16   Ht 6\' 1"  (1.854 m)   Wt 105.7 kg   SpO2 97%   BMI 30.74 kg/m   Physical Exam Vitals and nursing note reviewed.  Constitutional:      Appearance: He is well-developed.  HENT:     Head: Normocephalic and atraumatic.     Mouth/Throat:     Mouth: Mucous membranes are moist.     Pharynx: Oropharynx is clear.  Eyes:     Pupils: Pupils are equal, round, and reactive to light.  Cardiovascular:     Rate and Rhythm: Normal rate.  Pulmonary:     Effort: Pulmonary effort is normal. No respiratory distress.     Breath sounds: Rales (mild diffuse) present.  Abdominal:     General: There is no distension.  Musculoskeletal:        General: No swelling or tenderness. Normal range of motion.     Cervical back: Normal range of motion.  Skin:    General: Skin is warm and dry.  Neurological:     General: No focal deficit present.     Mental Status: He is alert.    ED Results / Procedures / Treatments   Labs (all labs ordered are listed, but only abnormal results are displayed) Labs Reviewed - No data to  display  EKG None  Radiology DG Chest 2 View  Result Date: 08/06/2021 CLINICAL DATA:  Flu symptoms and coughing. EXAM: CHEST - 2 VIEW COMPARISON:  None. FINDINGS: The heart size and mediastinal contours are within normal limits. Both lungs are clear. The visualized skeletal structures are unremarkable. IMPRESSION: No active cardiopulmonary disease. Electronically Signed   By: Telford Nab M.D.   On: 08/06/2021 21:38    Procedures Procedures   Medications Ordered in ED Medications - No data to display  ED Course  I have reviewed the triage vital signs and the nursing notes.  Pertinent labs & imaging results that were available during my care of the patient were reviewed by me and considered in my medical decision making (see chart for details).    MDM Rules/Calculators/A&P                         Low suspicion for bacterial pneumonia.  No high risk factors to suggest need for abx. No indication for antibiotics currently.   Final Clinical Impression(s) / ED Diagnoses Final diagnoses:  Acute cough    Rx / DC Orders ED Discharge Orders     None        Pleasant Bensinger, Corene Cornea, MD 08/06/21 2341

## 2021-10-21 ENCOUNTER — Ambulatory Visit (INDEPENDENT_AMBULATORY_CARE_PROVIDER_SITE_OTHER): Payer: BC Managed Care – PPO | Admitting: Internal Medicine

## 2021-10-21 ENCOUNTER — Other Ambulatory Visit: Payer: Self-pay

## 2021-10-21 ENCOUNTER — Encounter: Payer: Self-pay | Admitting: Internal Medicine

## 2021-10-21 ENCOUNTER — Ambulatory Visit (INDEPENDENT_AMBULATORY_CARE_PROVIDER_SITE_OTHER): Payer: BC Managed Care – PPO

## 2021-10-21 VITALS — BP 132/80 | HR 67 | Temp 98.2°F | Resp 16 | Wt 239.1 lb

## 2021-10-21 DIAGNOSIS — M25511 Pain in right shoulder: Secondary | ICD-10-CM

## 2021-10-21 DIAGNOSIS — R6882 Decreased libido: Secondary | ICD-10-CM | POA: Insufficient documentation

## 2021-10-21 DIAGNOSIS — N529 Male erectile dysfunction, unspecified: Secondary | ICD-10-CM

## 2021-10-21 DIAGNOSIS — G8929 Other chronic pain: Secondary | ICD-10-CM

## 2021-10-21 DIAGNOSIS — M722 Plantar fascial fibromatosis: Secondary | ICD-10-CM | POA: Diagnosis not present

## 2021-10-21 LAB — TESTOSTERONE TOTAL,FREE,BIO, MALES
Albumin: 4.3 g/dL (ref 3.6–5.1)
Sex Hormone Binding: 33 nmol/L (ref 10–50)
Testosterone, Bioavailable: 106.5 ng/dL — ABNORMAL LOW (ref 110.0–575.0)
Testosterone, Free: 54.1 pg/mL (ref 46.0–224.0)
Testosterone: 407 ng/dL (ref 250–827)

## 2021-10-21 LAB — PROLACTIN: Prolactin: 4.1 ng/mL (ref 2.0–18.0)

## 2021-10-21 MED ORDER — MELOXICAM 15 MG PO TABS: 15.0000 mg | ORAL_TABLET | Freq: Every day | ORAL | 1 refills | Status: DC

## 2021-10-21 NOTE — Progress Notes (Signed)
Subjective:  Patient ID: [facility], male    DOB: 1980-11-06  Age: 41 y.o. MRN: [id number]  CC: No chief complaint on file.  This visit occurred during the SARS-CoV-2 public health emergency.  Safety protocols were in place, including screening questions prior to the visit, additional usage of staff PPE, and extensive cleaning of exam room while observing appropriate contact time as indicated for disinfecting solutions.    HPI [facility] presents for f/up -  He complains of a 54-month history of pain on the bottom of his foot over the plantar surface.  He denies trauma or injury.  He also complains of a 1 month history of nontraumatic right shoulder pain with decreasing range of motion.  He is not taking anything for the pain.  Additionally he complains of erectile dysfunction and low libido.  No outpatient medications prior to visit.   No facility-administered medications prior to visit.    ROS Review of Systems  Constitutional:  Negative for chills, diaphoresis, fatigue and fever.  HENT: Negative.    Eyes: Negative.   Respiratory:  Negative for cough, chest tightness, shortness of breath and wheezing.   Cardiovascular:  Negative for chest pain, palpitations and leg swelling.  Gastrointestinal:  Negative for abdominal pain, constipation, diarrhea, nausea and vomiting.  Endocrine: Negative.   Genitourinary:  Negative for difficulty urinating, scrotal swelling and testicular pain.  Musculoskeletal:  Positive for arthralgias. Negative for back pain, myalgias and neck pain.  Skin: Negative.   Neurological:  Negative for dizziness, weakness, light-headedness and numbness.  Hematological:  Negative for adenopathy. Does not bruise/bleed easily.  Psychiatric/Behavioral: Negative.     Objective:  BP 132/80 (BP Location: Right Arm, Patient Position: Sitting, Cuff Size: Large)    Pulse 67    Temp 98.2 F (36.8 C)    Wt 239 lb 2 oz (108.5 kg)    SpO2 99%    BMI 31.55 kg/m   BP  Readings from Last 3 Encounters:  10/21/21 132/80  08/06/21 127/85  04/08/21 130/80    Wt Readings from Last 3 Encounters:  10/21/21 239 lb 2 oz (108.5 kg)  08/06/21 233 lb (105.7 kg)  04/08/21 248 lb (112.5 kg)    Physical Exam Vitals reviewed.  HENT:     Mouth/Throat:     Mouth: Mucous membranes are moist.  Eyes:     General: No scleral icterus.    Conjunctiva/sclera: Conjunctivae normal.  Cardiovascular:     Rate and Rhythm: Normal rate and regular rhythm.     Pulses:          Dorsalis pedis pulses are 2+ on the right side and 2+ on the left side.       Posterior tibial pulses are 2+ on the right side and 2+ on the left side.     Heart sounds: No murmur heard. Pulmonary:     Effort: Pulmonary effort is normal.     Breath sounds: No stridor. No wheezing, rhonchi or rales.  Abdominal:     General: Abdomen is flat.     Palpations: There is no mass.     Tenderness: There is no abdominal tenderness. There is no guarding.  Musculoskeletal:     Right shoulder: No swelling, deformity or bony tenderness. Decreased range of motion.     Left shoulder: No swelling.     Cervical back: Neck supple.     Right foot: Normal range of motion. No deformity.     Left foot: Normal range of  motion. No deformity.  Feet:     Right foot:     Skin integrity: Skin integrity normal.     Toenail Condition: Right toenails are normal.     Left foot:     Skin integrity: Skin integrity normal.     Toenail Condition: Left toenails are normal.  Lymphadenopathy:     Cervical: No cervical adenopathy.    Lab Results  Component Value Date   WBC 5.1 04/08/2021   HGB 14.8 04/08/2021   HCT 42.6 04/08/2021   PLT 229.0 04/08/2021   GLUCOSE 89 04/08/2021   CHOL 198 04/08/2021   TRIG 182.0 (H) 04/08/2021   HDL 30.20 (L) 04/08/2021   LDLCALC 131 (H) 04/08/2021   ALT 11 04/08/2021   AST 21 04/08/2021   NA 139 04/08/2021   K 3.9 04/08/2021   CL 104 04/08/2021   CREATININE 0.99 04/08/2021   BUN 11  04/08/2021   CO2 28 04/08/2021   TSH 2.70 04/08/2021    DG Chest 2 View  Result Date: 08/06/2021 CLINICAL DATA:  Flu symptoms and coughing. EXAM: CHEST - 2 VIEW COMPARISON:  None. FINDINGS: The heart size and mediastinal contours are within normal limits. Both lungs are clear. The visualized skeletal structures are unremarkable. IMPRESSION: No active cardiopulmonary disease. Electronically Signed   By: Telford Nab M.D.   On: 08/06/2021 21:38   DG Chest 2 View  Result Date: 08/06/2021 CLINICAL DATA:  Flu symptoms and coughing. EXAM: CHEST - 2 VIEW COMPARISON:  None. FINDINGS: The heart size and mediastinal contours are within normal limits. Both lungs are clear. The visualized skeletal structures are unremarkable. IMPRESSION: No active cardiopulmonary disease. Electronically Signed   By: Telford Nab M.D.   On: 08/06/2021 21:38   DG Shoulder Right  Result Date: 10/22/2021 CLINICAL DATA:  Pain for 1 month, no injury. EXAM: RIGHT SHOULDER - 2+ VIEW COMPARISON:  None. FINDINGS: There is no evidence of fracture or dislocation. There is no evidence of arthropathy or other focal bone abnormality. Soft tissues are unremarkable. IMPRESSION: Negative. Electronically Signed   By: Ronney Asters M.D.   On: 10/22/2021 01:01     Assessment & Plan:   Diagnoses and all orders for this visit:  Plantar fasciitis of right foot -     meloxicam (MOBIC) 15 MG tablet; Take 1 tablet (15 mg total) by mouth daily.  Chronic right shoulder pain- Plain films are normal.  I am concerned he has a rotator cuff disorder. -     DG Shoulder Right; Future -     Ambulatory referral to Orthopedic Surgery  Low libido-his testosterone level is normal. -     Testosterone Total,Free,Bio, Males; Future -     Prolactin; Future -     Prolactin -     Testosterone Total,Free,Bio, Males  Erectile dysfunction, unspecified erectile dysfunction type -     sildenafil (REVATIO) 20 MG tablet; Take 4 tablets (80 mg total) by mouth  daily as needed.   I am having [facility] "Joe" start on meloxicam and sildenafil.  Meds ordered this encounter  Medications   meloxicam (MOBIC) 15 MG tablet    Sig: Take 1 tablet (15 mg total) by mouth daily.    Dispense:  90 tablet    Refill:  1   sildenafil (REVATIO) 20 MG tablet    Sig: Take 4 tablets (80 mg total) by mouth daily as needed.    Dispense:  60 tablet    Refill:  3  Follow-up: Return in about 3 months (around 01/18/2022).  Scarlette Calico, MD

## 2021-10-21 NOTE — Patient Instructions (Signed)
Plantar Fasciitis  Plantar fasciitis is a painful foot condition that affects the heel. It occurs when the band of tissue that connects the toes to the heel bone (plantar fascia) becomes irritated. This can happen as the result of exercising too much or doing other repetitive activities (overuse injury). Plantar fasciitis can cause mild irritation to severe pain that makes it difficult to walk or move. The pain is usually worse in the morning after sleeping, or after sitting or lying down for a period of time. Pain may also beworse after long periods of walking or standing. What are the causes? This condition may be caused by: Standing for long periods of time. Wearing shoes that do not have good arch support. Doing activities that put stress on joints (high-impact activities). This includes ballet and exercise that makes your heart beat faster (aerobic exercise), such as running. Being overweight. An abnormal way of walking (gait). Tight muscles in the back of your lower leg (calf). High arches in your feet or flat feet. Starting a new athletic activity. What are the signs or symptoms? The main symptom of this condition is heel pain. Pain may get worse after the following: Taking the first steps after a time of rest, especially in the morning after awakening, or after you have been sitting or lying down for a while. Long periods of standing still. Pain may decrease after 30-45 minutes of activity, such as gentle walking. How is this diagnosed? This condition may be diagnosed based on your medical history, a physical exam, and your symptoms. Your health care provider will check for: A tender area on the bottom of your foot. A high arch in your foot or flat feet. Pain when you move your foot. Difficulty moving your foot. You may have imaging tests to confirm the diagnosis, such as: X-rays. Ultrasound. MRI. How is this treated? Treatment for plantar fasciitis depends on how severe your  condition is. Treatment may include: Rest, ice, pressure (compression), and raising (elevating) the affected foot. This is called RICE therapy. Your health care provider may recommend RICE therapy along with over-the-counter pain medicines to manage your pain. Exercises to stretch your calves and your plantar fascia. A splint that holds your foot in a stretched, upward position while you sleep (night splint). Physical therapy to relieve symptoms and prevent problems in the future. Injections of steroid medicine (cortisone) to relieve pain and inflammation. Stimulating your plantar fascia with electrical impulses (extracorporeal shock wave therapy). This is usually the last treatment option before surgery. Surgery, if other treatments have not worked after 12 months. Follow these instructions at home: Managing pain, stiffness, and swelling  If directed, put ice on the painful area. To do this: Put ice in a plastic bag, or use a frozen bottle of water. Place a towel between your skin and the bag or bottle. Roll the bottom of your foot over the bag or bottle. Do this for 20 minutes, 2-3 times a day. Wear athletic shoes that have air-sole or gel-sole cushions, or try soft shoe inserts that are designed for plantar fasciitis. Elevate your foot above the level of your heart while you are sitting or lying down.  Activity Avoid activities that cause pain. Ask your health care provider what activities are safe for you. Do physical therapy exercises and stretches as told by your health care provider. Try activities and forms of exercise that are easier on your joints (low impact). Examples include swimming, water aerobics, and biking. General instructions Take over-the-counter   and prescription medicines only as told by your health care provider. Wear a night splint while sleeping, if told by your health care provider. Loosen the splint if your toes tingle, become numb, or turn cold and blue. Maintain  a healthy weight, or work with your health care provider to lose weight as needed. Keep all follow-up visits. This is important. Contact a health care provider if you have: Symptoms that do not go away with home treatment. Pain that gets worse. Pain that affects your ability to move or do daily activities. Summary Plantar fasciitis is a painful foot condition that affects the heel. It occurs when the band of tissue that connects the toes to the heel bone (plantar fascia) becomes irritated. Heel pain is the main symptom of this condition. It may get worse after exercising too much or standing still for a long time. Treatment varies, but it usually starts with rest, ice, pressure (compression), and raising (elevating) the affected foot. This is called RICE therapy. Over-the-counter medicines can also be used to manage pain. This information is not intended to replace advice given to you by your health care provider. Make sure you discuss any questions you have with your healthcare provider. Document Revised: 12/25/2019 Document Reviewed: 12/25/2019 Elsevier Patient Education  2022 [employer] Inc.  

## 2021-10-22 ENCOUNTER — Encounter: Payer: Self-pay | Admitting: Internal Medicine

## 2021-10-22 DIAGNOSIS — N529 Male erectile dysfunction, unspecified: Secondary | ICD-10-CM | POA: Insufficient documentation

## 2021-10-22 MED ORDER — SILDENAFIL CITRATE 20 MG PO TABS: 80.0000 mg | ORAL_TABLET | Freq: Every day | ORAL | 3 refills | Status: DC | PRN

## 2021-10-23 ENCOUNTER — Ambulatory Visit: Payer: BC Managed Care – PPO | Admitting: Podiatry

## 2021-10-25 ENCOUNTER — Encounter: Payer: Self-pay | Admitting: Internal Medicine

## 2021-11-04 ENCOUNTER — Ambulatory Visit: Payer: BC Managed Care – PPO | Admitting: Orthopaedic Surgery

## 2021-11-13 ENCOUNTER — Emergency Department (HOSPITAL_BASED_OUTPATIENT_CLINIC_OR_DEPARTMENT_OTHER)
Admission: EM | Admit: 2021-11-13 | Discharge: 2021-11-13 | Disposition: A | Discharge status: Home / Self Care | Payer: BC Managed Care – PPO | Attending: Emergency Medicine | Admitting: Emergency Medicine

## 2021-11-13 ENCOUNTER — Other Ambulatory Visit (HOSPITAL_BASED_OUTPATIENT_CLINIC_OR_DEPARTMENT_OTHER): Payer: Self-pay

## 2021-11-13 ENCOUNTER — Other Ambulatory Visit: Payer: Self-pay

## 2021-11-13 ENCOUNTER — Encounter (HOSPITAL_BASED_OUTPATIENT_CLINIC_OR_DEPARTMENT_OTHER): Payer: Self-pay

## 2021-11-13 DIAGNOSIS — H538 Other visual disturbances: Secondary | ICD-10-CM | POA: Diagnosis not present

## 2021-11-13 DIAGNOSIS — H1031 Unspecified acute conjunctivitis, right eye: Secondary | ICD-10-CM | POA: Insufficient documentation

## 2021-11-13 MED ORDER — FLUORESCEIN SODIUM 1 MG OP STRP
1.0000 | ORAL_STRIP | Freq: Once | OPHTHALMIC | Status: AC
  Administered 2021-11-13: 1 via OPHTHALMIC
  Filled 2021-11-13: qty 1

## 2021-11-13 MED ORDER — TETRACAINE HCL 0.5 % OP SOLN
2.0000 [drp] | Freq: Once | OPHTHALMIC | Status: AC
  Administered 2021-11-13: 2 [drp] via OPHTHALMIC
  Filled 2021-11-13: qty 4

## 2021-11-13 MED ORDER — ERYTHROMYCIN 5 MG/GM OP OINT
TOPICAL_OINTMENT | OPHTHALMIC | 0 refills | Status: DC
  Filled 2021-11-13: qty 3.5, 5d supply, fill #0

## 2021-11-13 NOTE — ED Triage Notes (Signed)
Pt came in POV c/o of "pink eye". Pt states he noted his Right Eye to be red and draining since yesterday and no pain noted. Pt denies fevers.

## 2021-11-13 NOTE — ED Provider Notes (Signed)
[facility] Provider Note   CSN: [id number] Arrival date & time: 11/13/21  9379     History  Chief Complaint  Patient presents with   Conjunctivitis    Frank Sullivan is a 41 y.o. male.  The history is provided by the patient. No language interpreter was used.  Conjunctivitis   41 year old male who presents for evaluation of eye discomfort.  Patient reports since yesterday he has had redness in his right eye.  He endorsed some mild discharge but otherwise he does not have any other complaint.  Denies eye pain, pain with eye movement, double vision, vision loss, foreign body sensation, headache, runny nose.  He does not wear contact lens.  He denies any recent trauma.  He denies any specific treatment tried.  He denies any significant itchiness.  Home Medications Prior to Admission medications   Medication Sig Start Date End Date Taking? Authorizing Provider  meloxicam (MOBIC) 15 MG tablet Take 1 tablet (15 mg total) by mouth daily. 10/21/21   Janith Lima, MD  sildenafil (REVATIO) 20 MG tablet Take 4 tablets (80 mg total) by mouth daily as needed. 10/22/21   Janith Lima, MD      Allergies    Patient has no known allergies.    Review of Systems   Review of Systems  Constitutional:  Negative for fever.  Eyes:  Positive for pain, discharge and redness. Negative for photophobia, itching and visual disturbance.   Physical Exam Updated Vital Signs BP (!) 151/109 (BP Location: Right Arm)    Pulse 69    Temp 98 F (36.7 C) (Oral)    Resp 16    Ht 6\' 1"  (1.854 m)    Wt 104.3 kg    SpO2 99%    BMI 30.34 kg/m  Physical Exam Vitals and nursing note reviewed.  Constitutional:      General: He is not in acute distress.    Appearance: He is well-developed.  HENT:     Head: Atraumatic.  Eyes:     General: Lids are normal. Lids are everted, no foreign bodies appreciated. Vision grossly intact. Gaze aligned appropriately. No visual field deficit.        Right eye: No foreign body, discharge or hordeolum.     Intraocular pressure: Right eye pressure is 18 mmHg.     Extraocular Movements: Extraocular movements intact.     Right eye: Normal extraocular motion.     Left eye: Normal extraocular motion.     Conjunctiva/sclera:     Right eye: Right conjunctiva is injected. No chemosis, exudate or hemorrhage.    Left eye: Left conjunctiva is not injected. No chemosis, exudate or hemorrhage.    Pupils: Pupils are equal, round, and reactive to light.     Right eye: Pupil is round, reactive and not sluggish. No corneal abrasion or fluorescein uptake. Seidel exam negative.     Slit lamp exam:    Right eye: No corneal ulcer, foreign body, hyphema or hypopyon.  Musculoskeletal:     Cervical back: Neck supple.  Skin:    Findings: No rash.  Neurological:     Mental Status: He is alert.    ED Results / Procedures / Treatments   Labs (all labs ordered are listed, but only abnormal results are displayed) Labs Reviewed - No data to display  EKG None  Radiology No results found.  Procedures Procedures    Medications Ordered in ED Medications - No data to display  ED Course/ Medical Decision Making/ A&P                           Medical Decision Making  BP (!) 151/109 (BP Location: Right Arm)    Pulse 69    Temp 98 F (36.7 C) (Oral)    Resp 16    Ht 6\' 1"  (1.854 m)    Wt 104.3 kg    SpO2 99%    BMI 30.34 kg/m   10:21 AM Patient here with complaints of redness to his right eye.  No significant eye pain or vision changes reported.  Differential diagnosis includes viral conjunctivitis, corneal abrasion, bacterial conjunctivitis, iritis, uveitis, keratitis, acute angle glaucoma, vitreous globe rupture  Patient has normal visual acuity, normal intraocular pressure, low suspicion for acute angle glaucoma, no foreign body noted no evidence of corneal abrasion noted.  No significant eye pain to suggest iritis uveitis or keratitis.  No foreign  body noted.  Symptoms likely either viral conjunctivitis versus bacterial conjunctivitis.  Low suspicion for gonococcal or chlamydial conjunctivitis.  Will prescribe erythromycin ointment for coverage of potential bacterial conjunctivitis.  We will also provide referral to ophthalmology for outpatient follow-up as needed.  Return precaution given.        Final Clinical Impression(s) / ED Diagnoses Final diagnoses:  Acute conjunctivitis of right eye, unspecified acute conjunctivitis type    Rx / DC Orders ED Discharge Orders          Ordered    erythromycin ophthalmic ointment        11/13/21 1033              Domenic Moras, PA-C 11/13/21 1035    Lacretia Leigh, MD 11/17/21 1019

## 2021-11-24 ENCOUNTER — Ambulatory Visit: Payer: BC Managed Care – PPO | Admitting: Internal Medicine

## 2022-01-15 ENCOUNTER — Encounter: Payer: Self-pay | Admitting: Podiatry

## 2022-01-15 ENCOUNTER — Ambulatory Visit (INDEPENDENT_AMBULATORY_CARE_PROVIDER_SITE_OTHER): Payer: BC Managed Care – PPO

## 2022-01-15 ENCOUNTER — Ambulatory Visit (INDEPENDENT_AMBULATORY_CARE_PROVIDER_SITE_OTHER): Payer: BC Managed Care – PPO | Admitting: Podiatry

## 2022-01-15 DIAGNOSIS — M722 Plantar fascial fibromatosis: Secondary | ICD-10-CM | POA: Diagnosis not present

## 2022-01-15 DIAGNOSIS — D169 Benign neoplasm of bone and articular cartilage, unspecified: Secondary | ICD-10-CM

## 2022-01-15 MED ORDER — TRIAMCINOLONE ACETONIDE 10 MG/ML IJ SUSP
20.0000 mg | Freq: Once | INTRAMUSCULAR | Status: AC
  Administered 2022-01-15: 20 mg

## 2022-01-15 MED ORDER — PREDNISONE 10 MG PO TABS: ORAL_TABLET | ORAL | 0 refills | Status: DC

## 2022-01-15 NOTE — Patient Instructions (Signed)

## 2022-01-15 NOTE — Progress Notes (Signed)
Subjective:  ? ?Patient ID: Frank Sullivan, male   DOB: 41 y.o.   MRN: [id number]  ? ?HPI ?Patient states he has developed severe discomfort in his plantar heel of both feet right over left stating its been present for around 3 months and gradually getting worse.  Hurts all day and worse when he gets up or after periods of sitting.  Patient does not smoke likes to be active ? ? ?Review of Systems  ?All other systems reviewed and are negative. ? ? ?   ?Objective:  ?Physical Exam ?Vitals and nursing note reviewed.  ?Constitutional:   ?   Appearance: He is well-developed.  ?Pulmonary:  ?   Effort: Pulmonary effort is normal.  ?Musculoskeletal:     ?   General: Normal range of motion.  ?Skin: ?   General: Skin is warm.  ?Neurological:  ?   Mental Status: He is alert.  ?  ?Neurovascular status found to be intact muscle strength found to be adequate range of motion within normal limits.  Patient is found to have exquisite discomfort plantar fascia right over left at the insertional point of the tendon into the calcaneus with inflammation fluid around the medial band.  Moderate depression of the arch noted good digital perfusion well oriented x3 acute ? ?   ?Assessment:  ?Acute Planter fasciitis right with fluid buildup right over left ? ?   ?Plan:  ?H&P reviewed condition has already been on oral anti-inflammatory from primary care so today I did do x-rays I then did sterile prep injected the plantar fascia at insertion 3 mg Kenalog 5 mg Xylocaine applied fascial brace right instructed on physical therapy shoe gear modifications and not going barefoot and reappoint in 2 weeks.  Also placed on Sterapred DS Dosepak to try to reduce the inflammation that is present to an extensive degree ? ?X-rays indicate small spur formation left over right no indication stress fracture arthritis ?   ? ? ?

## 2022-01-29 ENCOUNTER — Ambulatory Visit: Payer: BC Managed Care – PPO | Admitting: Podiatry

## 2022-01-30 ENCOUNTER — Ambulatory Visit (INDEPENDENT_AMBULATORY_CARE_PROVIDER_SITE_OTHER): Payer: BC Managed Care – PPO | Admitting: Podiatry

## 2022-01-30 ENCOUNTER — Encounter: Payer: Self-pay | Admitting: Podiatry

## 2022-01-30 DIAGNOSIS — M722 Plantar fascial fibromatosis: Secondary | ICD-10-CM

## 2022-01-30 NOTE — Progress Notes (Signed)
Subjective:  ? ?Patient ID: Frank Sullivan, male   DOB: 41 y.o.   MRN: [id number]  ? ?HPI ?Patient states doing much better with diminished discomfort still having some pain but much better than it was and walking with a better heel toe gait ? ? ?ROS ? ? ?   ?Objective:  ?Physical Exam  ?Neurovascular status intact pain in the plantar heel improved quite a degree with discomfort only upon deep palpation right ? ?   ?Assessment:  ?Significant improvement acute Planter fasciitis right over left ? ?   ?Plan:  ?Reviewed continued exercises anti-inflammatories physical therapy and supportive shoes.  Patient is discharged will be seen back as needed ?   ? ? ?

## 2022-04-09 ENCOUNTER — Encounter: Payer: Self-pay | Admitting: Internal Medicine

## 2022-04-09 ENCOUNTER — Ambulatory Visit (INDEPENDENT_AMBULATORY_CARE_PROVIDER_SITE_OTHER): Payer: BC Managed Care – PPO | Admitting: Internal Medicine

## 2022-04-09 VITALS — BP 120/78 | HR 75 | Temp 97.7°F | Ht 73.0 in | Wt 244.0 lb

## 2022-04-09 DIAGNOSIS — Z0000 Encounter for general adult medical examination without abnormal findings: Secondary | ICD-10-CM | POA: Diagnosis not present

## 2022-04-09 DIAGNOSIS — Z136 Encounter for screening for cardiovascular disorders: Secondary | ICD-10-CM | POA: Diagnosis not present

## 2022-04-09 DIAGNOSIS — Z0001 Encounter for general adult medical examination with abnormal findings: Secondary | ICD-10-CM

## 2022-04-09 DIAGNOSIS — Z23 Encounter for immunization: Secondary | ICD-10-CM

## 2022-04-09 LAB — LIPID PANEL
Cholesterol: 201 mg/dL — ABNORMAL HIGH (ref 0–200)
HDL: 32.2 mg/dL — ABNORMAL LOW (ref >39.00)
NonHDL: 168.63
Total CHOL/HDL Ratio: 6
Triglycerides: 215 mg/dL — ABNORMAL HIGH (ref 0.0–149.0)
VLDL: 43 mg/dL — ABNORMAL HIGH (ref 0.0–40.0)

## 2022-04-09 LAB — LDL CHOLESTEROL, DIRECT: Direct LDL: 124 mg/dL

## 2022-04-09 MED ORDER — BOOSTRIX 5-2.5-18.5 LF-MCG/0.5 IM SUSP: 0.5000 mL | Freq: Once | INTRAMUSCULAR | 0 refills | Status: AC

## 2022-04-09 NOTE — Progress Notes (Signed)
Subjective:  Patient ID: Frank Sullivan, male    DOB: 1981-03-29  Age: 41 y.o. MRN: [id number]  CC: Annual Exam   HPI Frank Sullivan presents for a CPX.  He feels well.  Outpatient Medications Prior to Visit  Medication Sig Dispense Refill   predniSONE (DELTASONE) 10 MG tablet 12 day tapering dose 48 tablet 0   No facility-administered medications prior to visit.    ROS Review of Systems  Constitutional:  Negative for fatigue.  Respiratory:  Negative for cough and shortness of breath.   Cardiovascular:  Negative for chest pain, palpitations and leg swelling.  Gastrointestinal:  Negative for abdominal pain.  Genitourinary:  Negative for difficulty urinating, penile swelling, scrotal swelling and testicular pain.  Musculoskeletal: Negative.   Skin: Negative.   Neurological: Negative.   Hematological: Negative.   Psychiatric/Behavioral: Negative.    All other systems reviewed and are negative.   Objective:  BP 120/78 (BP Location: Left Arm, Patient Position: Sitting, Cuff Size: Large)   Pulse 75   Temp 97.7 F (36.5 C) (Oral)   Ht '6\' 1"'$  (1.854 m)   Wt 244 lb (110.7 kg)   SpO2 97%   BMI 32.19 kg/m   BP Readings from Last 3 Encounters:  04/09/22 120/78  11/13/21 (!) 151/109  10/21/21 132/80    Wt Readings from Last 3 Encounters:  04/09/22 244 lb (110.7 kg)  11/13/21 230 lb (104.3 kg)  10/21/21 239 lb 2 oz (108.5 kg)    Physical Exam Vitals reviewed.  Constitutional:      Appearance: Normal appearance.  HENT:     Nose: Nose normal.     Mouth/Throat:     Mouth: Mucous membranes are moist.  Eyes:     General: No scleral icterus.    Conjunctiva/sclera: Conjunctivae normal.  Cardiovascular:     Rate and Rhythm: Normal rate and regular rhythm.     Heart sounds: No murmur heard. Pulmonary:     Effort: Pulmonary effort is normal.     Breath sounds: No stridor. No wheezing, rhonchi or rales.  Abdominal:     General: Abdomen is flat.     Palpations: There  is no mass.     Tenderness: There is no abdominal tenderness. There is no guarding.     Hernia: No hernia is present.  Musculoskeletal:        General: Normal range of motion.     Cervical back: Neck supple.     Right lower leg: No edema.     Left lower leg: No edema.  Lymphadenopathy:     Cervical: No cervical adenopathy.  Skin:    General: Skin is warm and dry.  Neurological:     General: No focal deficit present.  Psychiatric:        Mood and Affect: Mood normal.        Behavior: Behavior normal.     Lab Results  Component Value Date   WBC 5.1 04/08/2021   HGB 14.8 04/08/2021   HCT 42.6 04/08/2021   PLT 229.0 04/08/2021   GLUCOSE 89 04/08/2021   CHOL 201 (H) 04/09/2022   TRIG 215.0 (H) 04/09/2022   HDL 32.20 (L) 04/09/2022   LDLDIRECT 124.0 04/09/2022   LDLCALC 131 (H) 04/08/2021   ALT 11 04/08/2021   AST 21 04/08/2021   NA 139 04/08/2021   K 3.9 04/08/2021   CL 104 04/08/2021   CREATININE 0.99 04/08/2021   BUN 11 04/08/2021   CO2 28 04/08/2021   TSH  2.70 04/08/2021    No results found.  Assessment & Plan:   Frank Sullivan was seen today for annual exam.  Diagnoses and all orders for this visit:  Encounter for general adult medical examination with abnormal findings-exam completed, labs reviewed, vaccines reviewed and updated, patient education was given. -     Lipid panel; Future -     Lipid panel  Need for prophylactic vaccination with combined diphtheria-tetanus-pertussis (DTP) vaccine -     Tdap (BOOSTRIX) 5-2.5-18.5 LF-MCG/0.5 injection; Inject 0.5 mLs into the muscle once for 1 dose.  Other orders -     LDL cholesterol, direct   I am having Frank Sullivan "Frank Sullivan" start on Boostrix. I am also having him maintain his predniSONE.  Meds ordered this encounter  Medications   Tdap (BOOSTRIX) 5-2.5-18.5 LF-MCG/0.5 injection    Sig: Inject 0.5 mLs into the muscle once for 1 dose.    Dispense:  0.5 mL    Refill:  0     Follow-up: Return if symptoms worsen  or fail to improve.  Scarlette Calico, MD

## 2022-04-09 NOTE — Patient Instructions (Signed)
Health Maintenance, Male Adopting a healthy lifestyle and getting preventive care are important in promoting health and wellness. Ask your health care provider about: The right schedule for you to have regular tests and exams. Things you can do on your own to prevent diseases and keep yourself healthy. What should I know about diet, weight, and exercise? Eat a healthy diet  Eat a diet that includes plenty of vegetables, fruits, low-fat dairy products, and lean protein. Do not eat a lot of foods that are high in solid fats, added sugars, or sodium. Maintain a healthy weight Body mass index (BMI) is a measurement that can be used to identify possible weight problems. It estimates body fat based on height and weight. Your health care provider can help determine your BMI and help you achieve or maintain a healthy weight. Get regular exercise Get regular exercise. This is one of the most important things you can do for your health. Most adults should: Exercise for at least 150 minutes each week. The exercise should increase your heart rate and make you sweat (moderate-intensity exercise). Do strengthening exercises at least twice a week. This is in addition to the moderate-intensity exercise. Spend less time sitting. Even light physical activity can be beneficial. Watch cholesterol and blood lipids Have your blood tested for lipids and cholesterol at 39 years of age, then have this test every 5 years. You may need to have your cholesterol levels checked more often if: Your lipid or cholesterol levels are high. You are older than 40 years of age. You are at high risk for heart disease. What should I know about cancer screening? Many types of cancers can be detected early and may often be prevented. Depending on your health history and family history, you may need to have cancer screening at various ages. This may include screening for: Colorectal cancer. Prostate cancer. Skin cancer. Lung  cancer. What should I know about heart disease, diabetes, and high blood pressure? Blood pressure and heart disease High blood pressure causes heart disease and increases the risk of stroke. This is more likely to develop in people who have high blood pressure readings or are overweight. Talk with your health care provider about your target blood pressure readings. Have your blood pressure checked: Every 3-5 years if you are 18-39 years of age. Every year if you are 40 years old or older. If you are between the ages of 65 and 75 and are a current or former smoker, ask your health care provider if you should have a one-time screening for abdominal aortic aneurysm (AAA). Diabetes Have regular diabetes screenings. This checks your fasting blood sugar level. Have the screening done: Once every three years after age 45 if you are at a normal weight and have a low risk for diabetes. More often and at a younger age if you are overweight or have a high risk for diabetes. What should I know about preventing infection? Hepatitis B If you have a higher risk for hepatitis B, you should be screened for this virus. Talk with your health care provider to find out if you are at risk for hepatitis B infection. Hepatitis C Blood testing is recommended for: Everyone born from 1945 through 1965. Anyone with known risk factors for hepatitis C. Sexually transmitted infections (STIs) You should be screened each year for STIs, including gonorrhea and chlamydia, if: You are sexually active and are younger than 39 years of age. You are older than 39 years of age and your   health care provider tells you that you are at risk for this type of infection. Your sexual activity has changed since you were last screened, and you are at increased risk for chlamydia or gonorrhea. Ask your health care provider if you are at risk. Ask your health care provider about whether you are at high risk for HIV. Your health care provider  may recommend a prescription medicine to help prevent HIV infection. If you choose to take medicine to prevent HIV, you should first get tested for HIV. You should then be tested every 3 months for as long as you are taking the medicine. Follow these instructions at home: Alcohol use Do not drink alcohol if your health care provider tells you not to drink. If you drink alcohol: Limit how much you have to 0-2 drinks a day. Know how much alcohol is in your drink. In the [place] one drink equals one 12 oz bottle of beer (355 mL), one 5 oz glass of wine (148 mL), or one 1 oz glass of hard liquor (44 mL). Lifestyle Do not use any products that contain nicotine or tobacco. These products include cigarettes, chewing tobacco, and vaping devices, such as e-cigarettes. If you need help quitting, ask your health care provider. Do not use street drugs. Do not share needles. Ask your health care provider for help if you need support or information about quitting drugs. General instructions Schedule regular health, dental, and eye exams. Stay current with your vaccines. Tell your health care provider if: You often feel depressed. You have ever been abused or do not feel safe at home. Summary Adopting a healthy lifestyle and getting preventive care are important in promoting health and wellness. Follow your health care provider's instructions about healthy diet, exercising, and getting tested or screened for diseases. Follow your health care provider's instructions on monitoring your cholesterol and blood pressure. This information is not intended to replace advice given to you by your health care provider. Make sure you discuss any questions you have with your health care provider. Document Revised: 01/27/2021 Document Reviewed: 01/27/2021 Elsevier Patient Education  2023 [employer] Inc.  

## 2022-09-09 ENCOUNTER — Ambulatory Visit: Payer: BC Managed Care – PPO | Admitting: Podiatry

## 2022-09-11 ENCOUNTER — Ambulatory Visit: Payer: BC Managed Care – PPO | Admitting: Podiatry

## 2023-03-30 IMAGING — DX DG CHEST 2V
2 series · 2 of 2 positions shown · non-contrast
Comparison: None.

CLINICAL DATA: Flu symptoms and coughing.

EXAM:
CHEST - 2 VIEW

[chest pa]
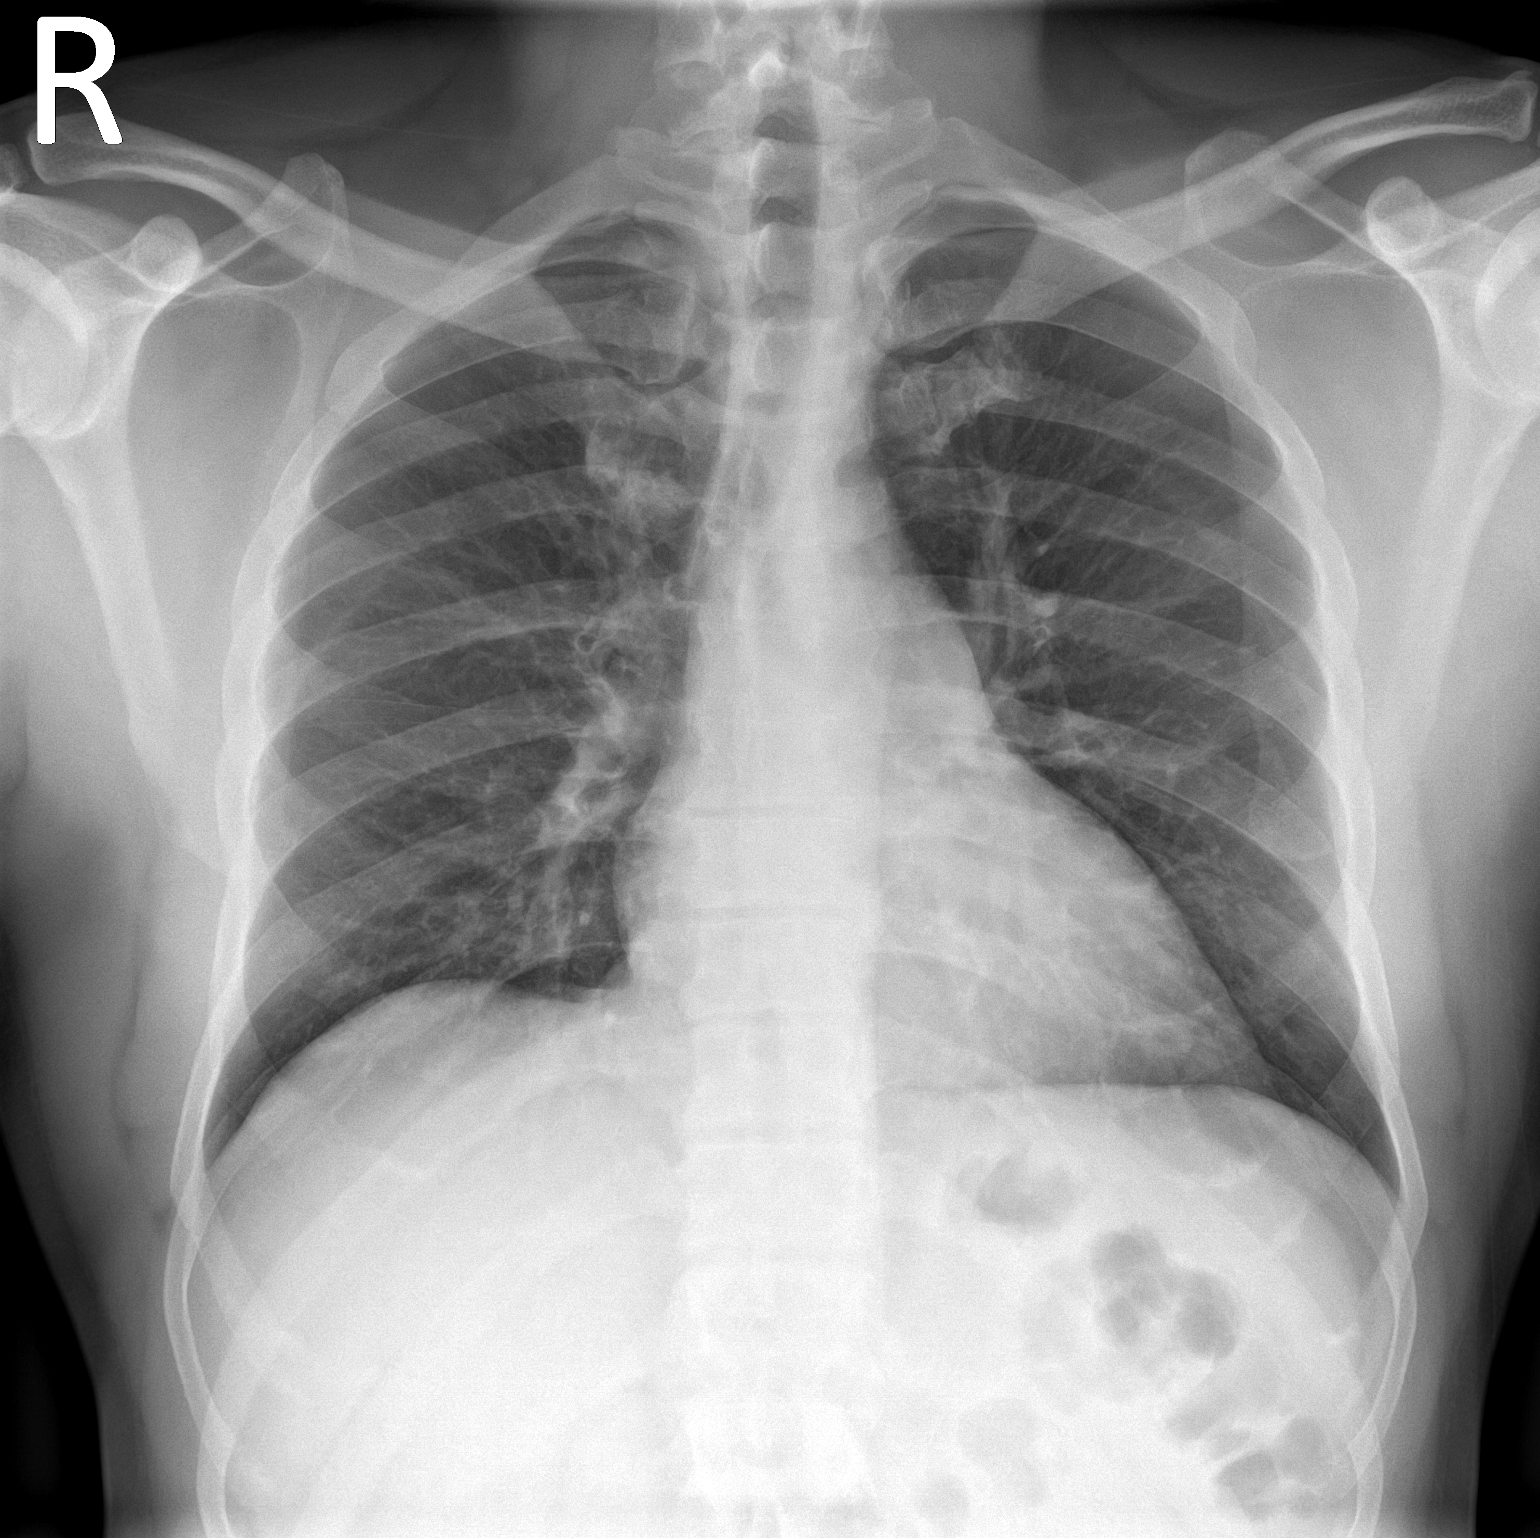

[chest lat]
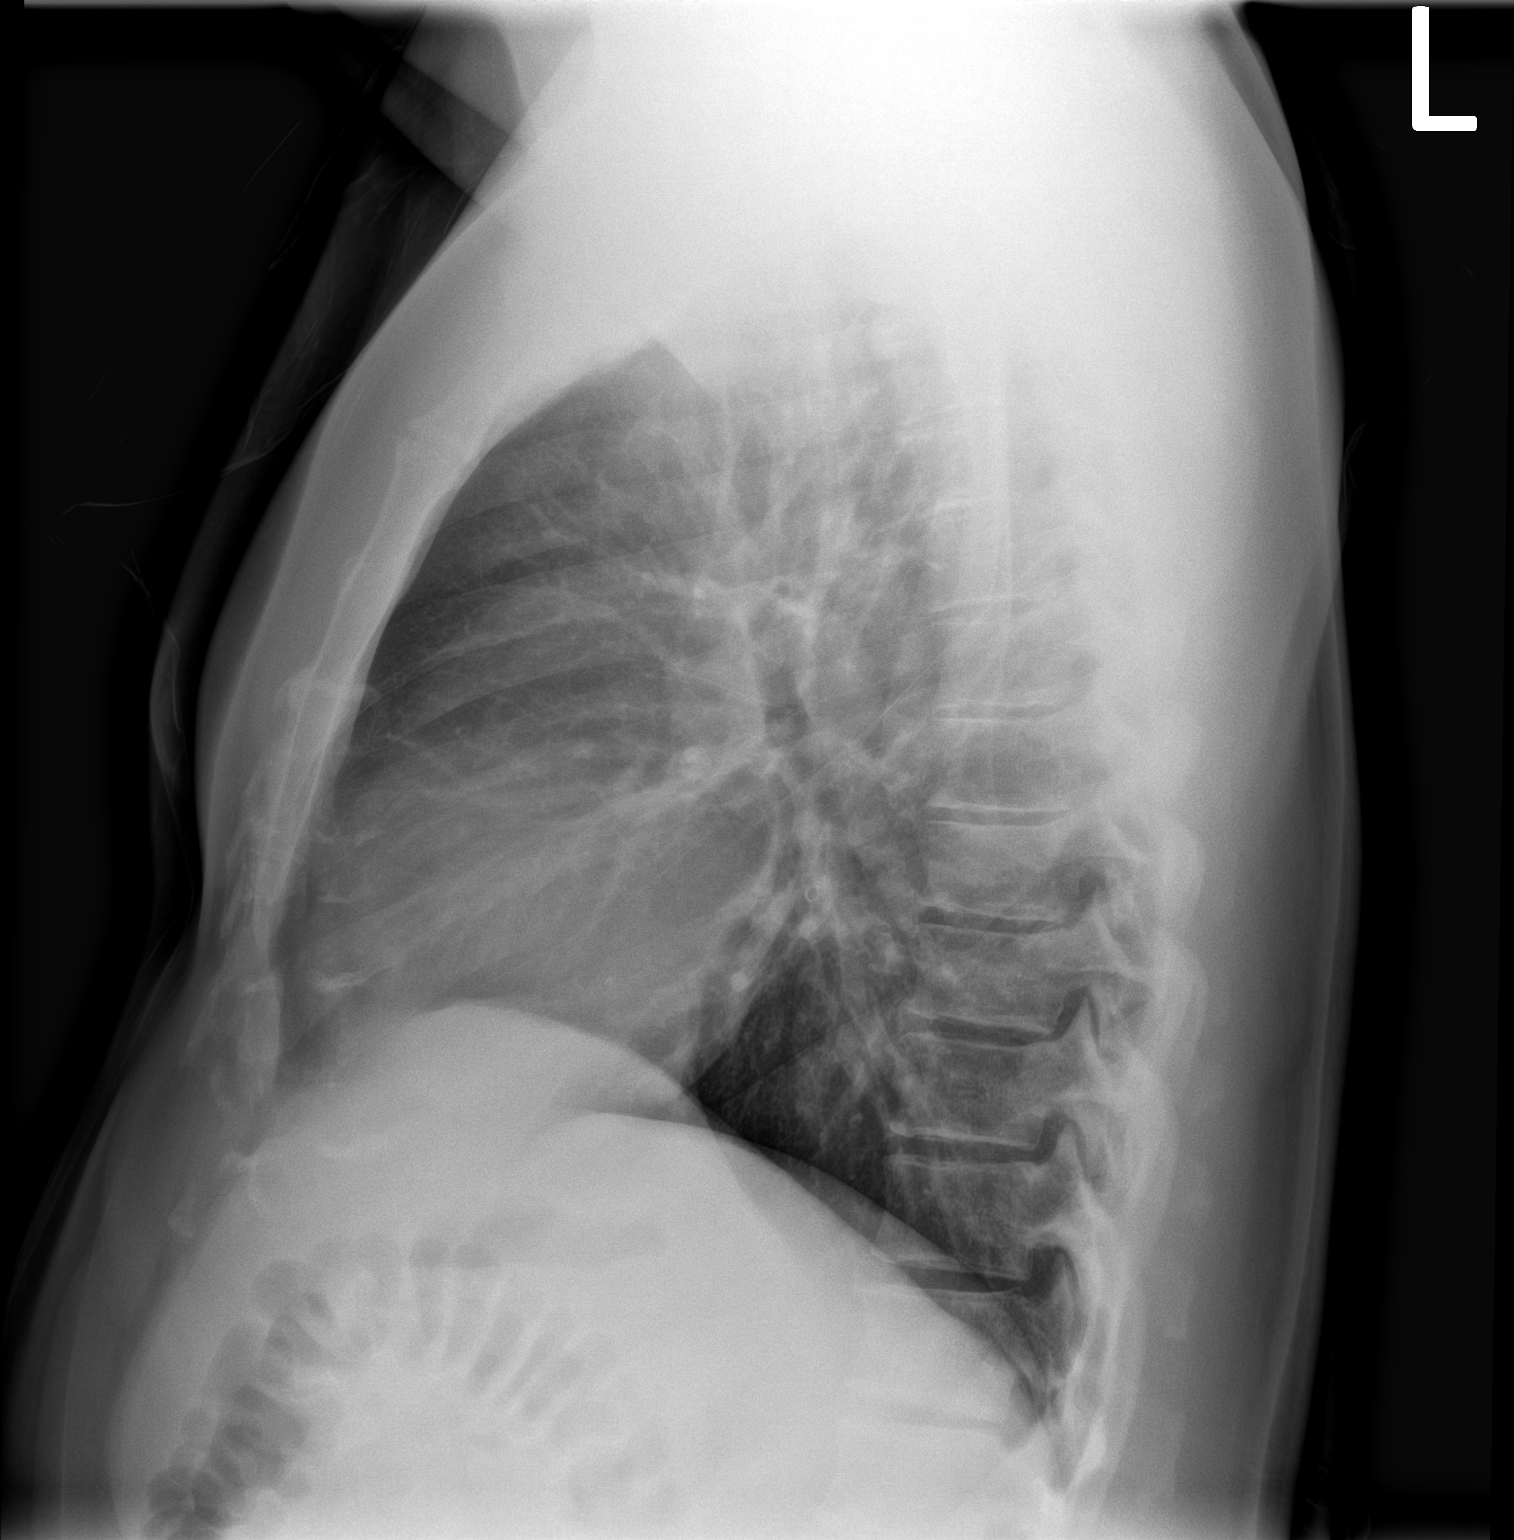

[2 of 2 positions shown; findings below may reference images not displayed]

FINDINGS: The heart size and mediastinal contours are within normal limits.
Both lungs are clear. The visualized skeletal structures are
unremarkable.
IMPRESSION: No active cardiopulmonary disease.

## 2023-06-14 IMAGING — DX DG SHOULDER 2+V*R*
3 series · 3 of 3 positions shown · non-contrast
Comparison: None.

CLINICAL DATA: Pain for 1 month, no injury.

EXAM:
RIGHT SHOULDER - 2+ VIEW

[shoulder ap (1 of 2)]
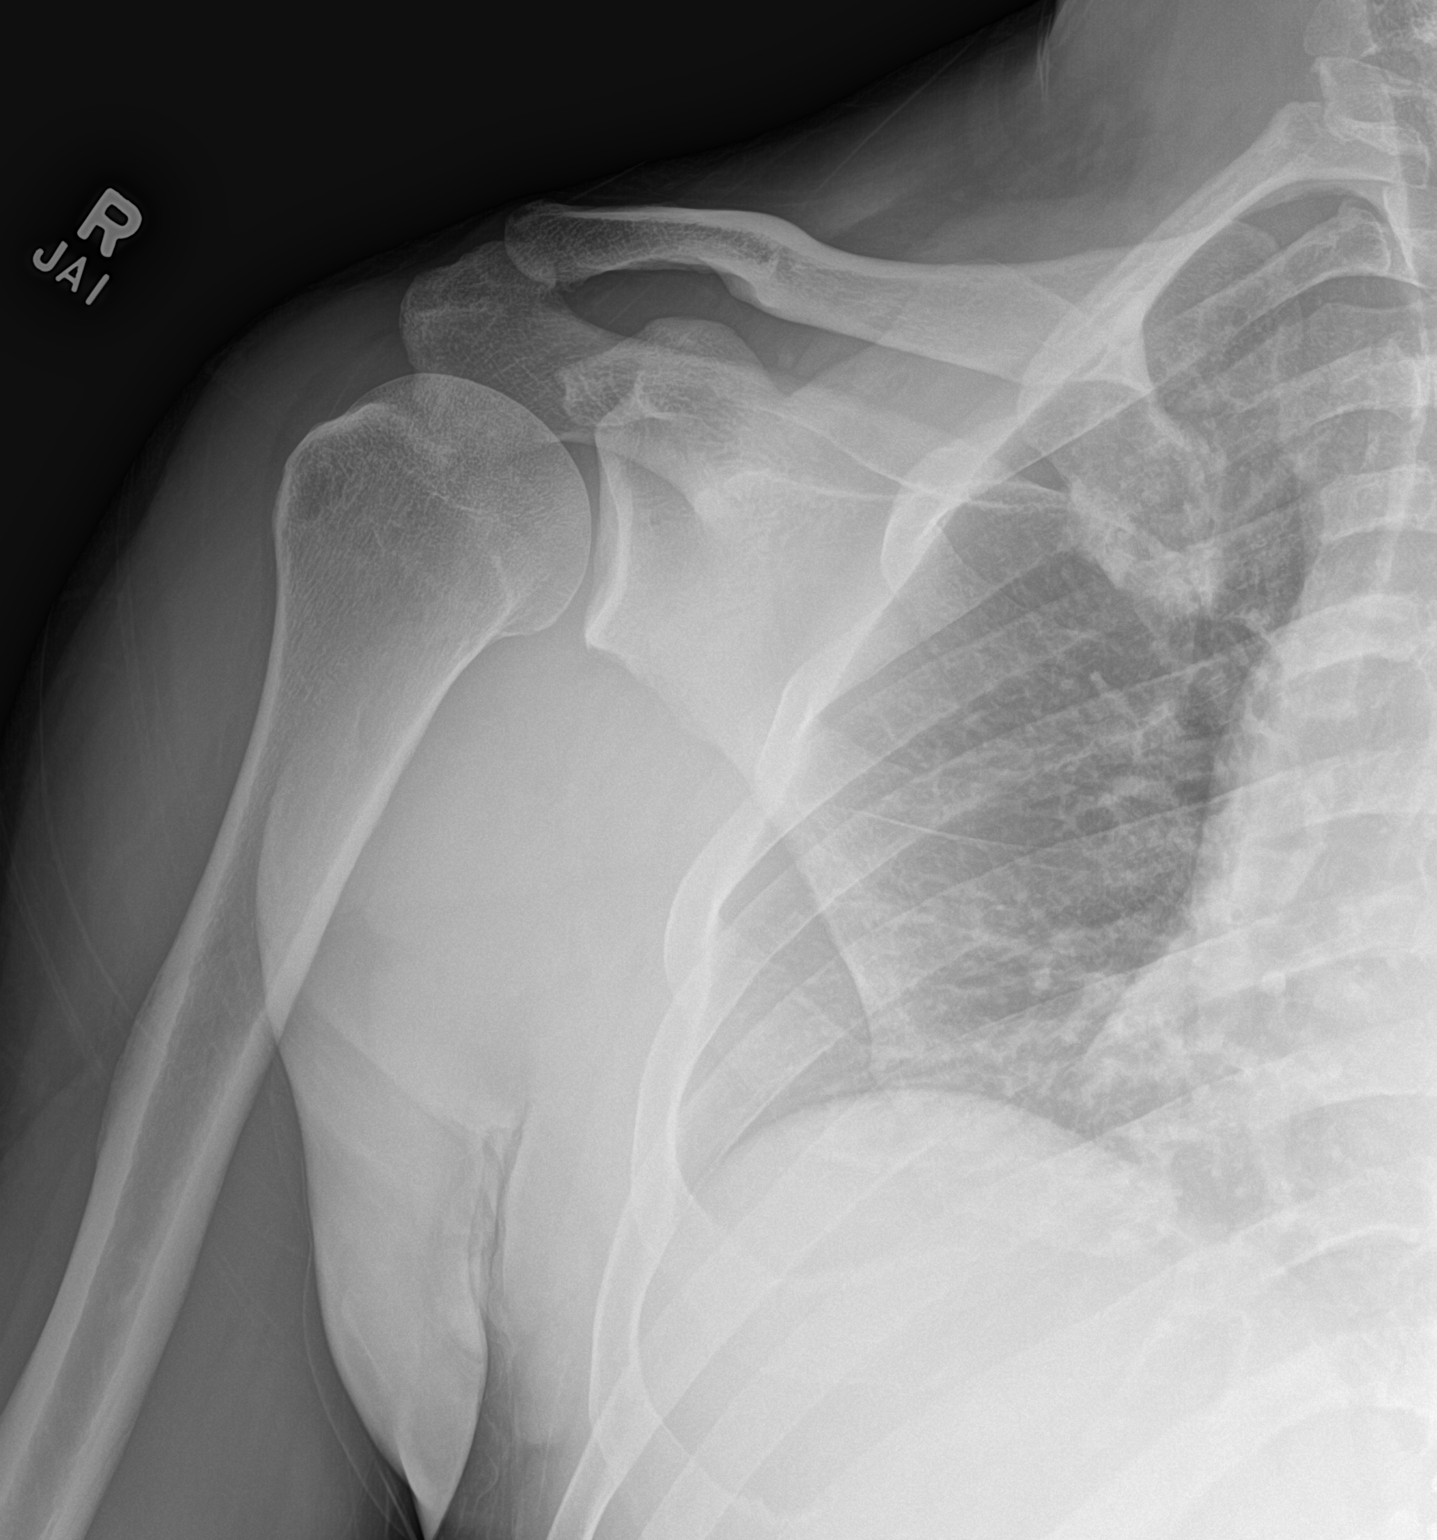

[shoulder ap (2 of 2)]
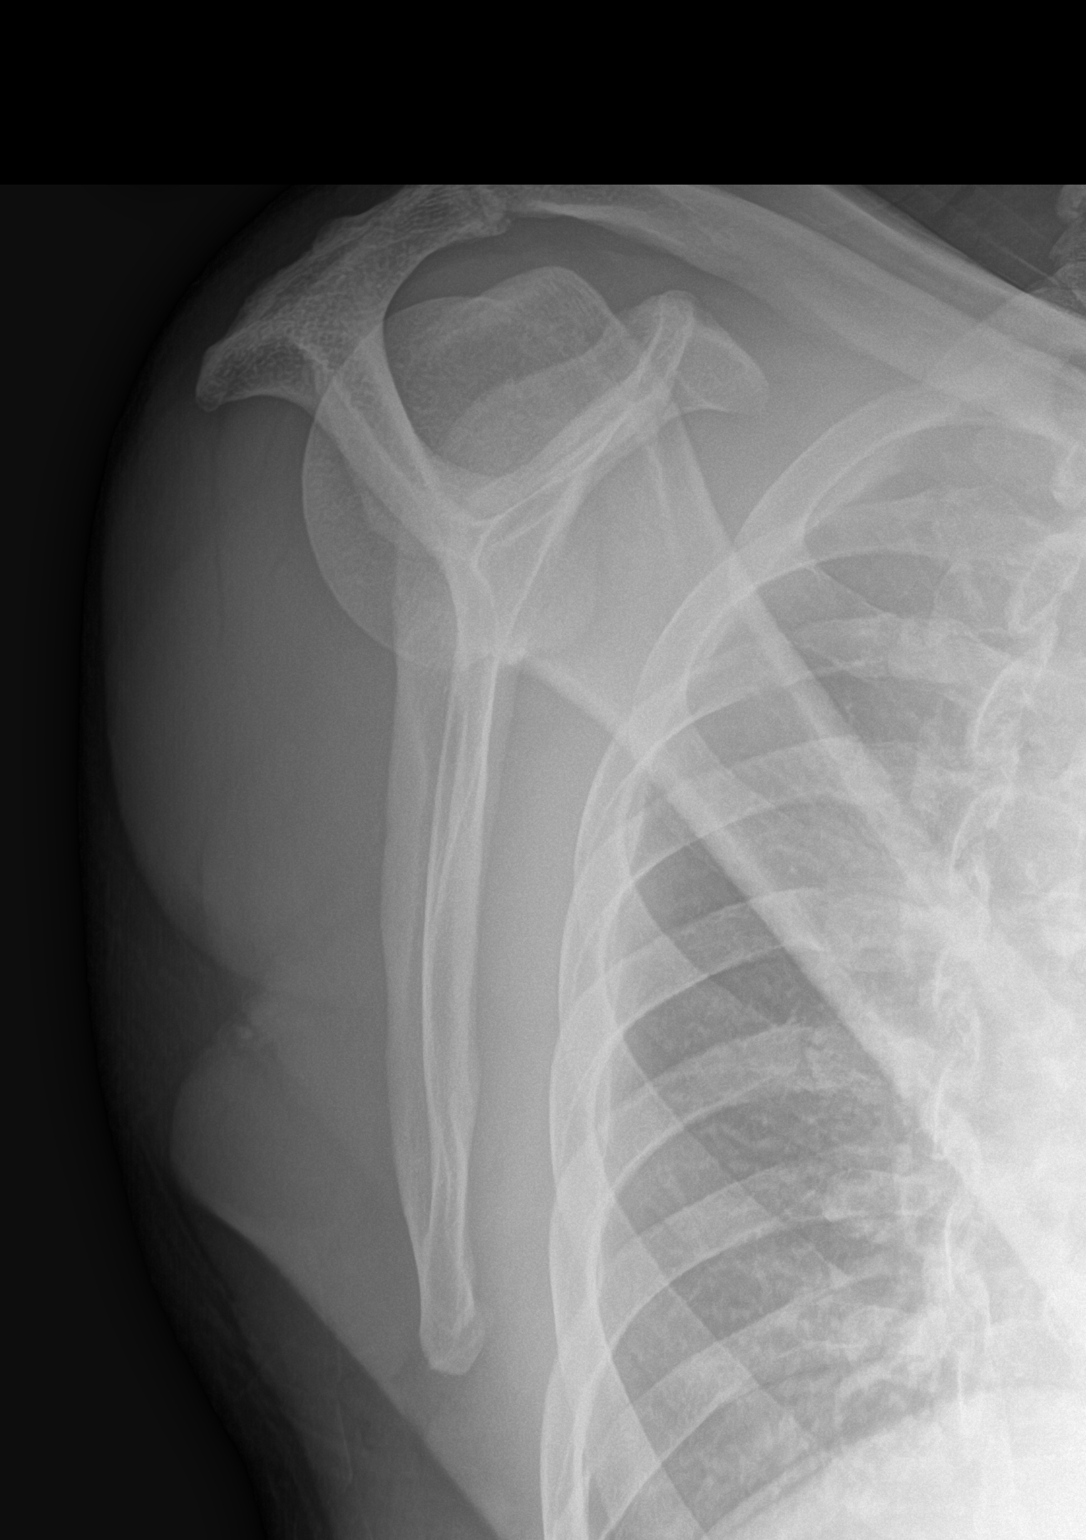

[shoulder axial]
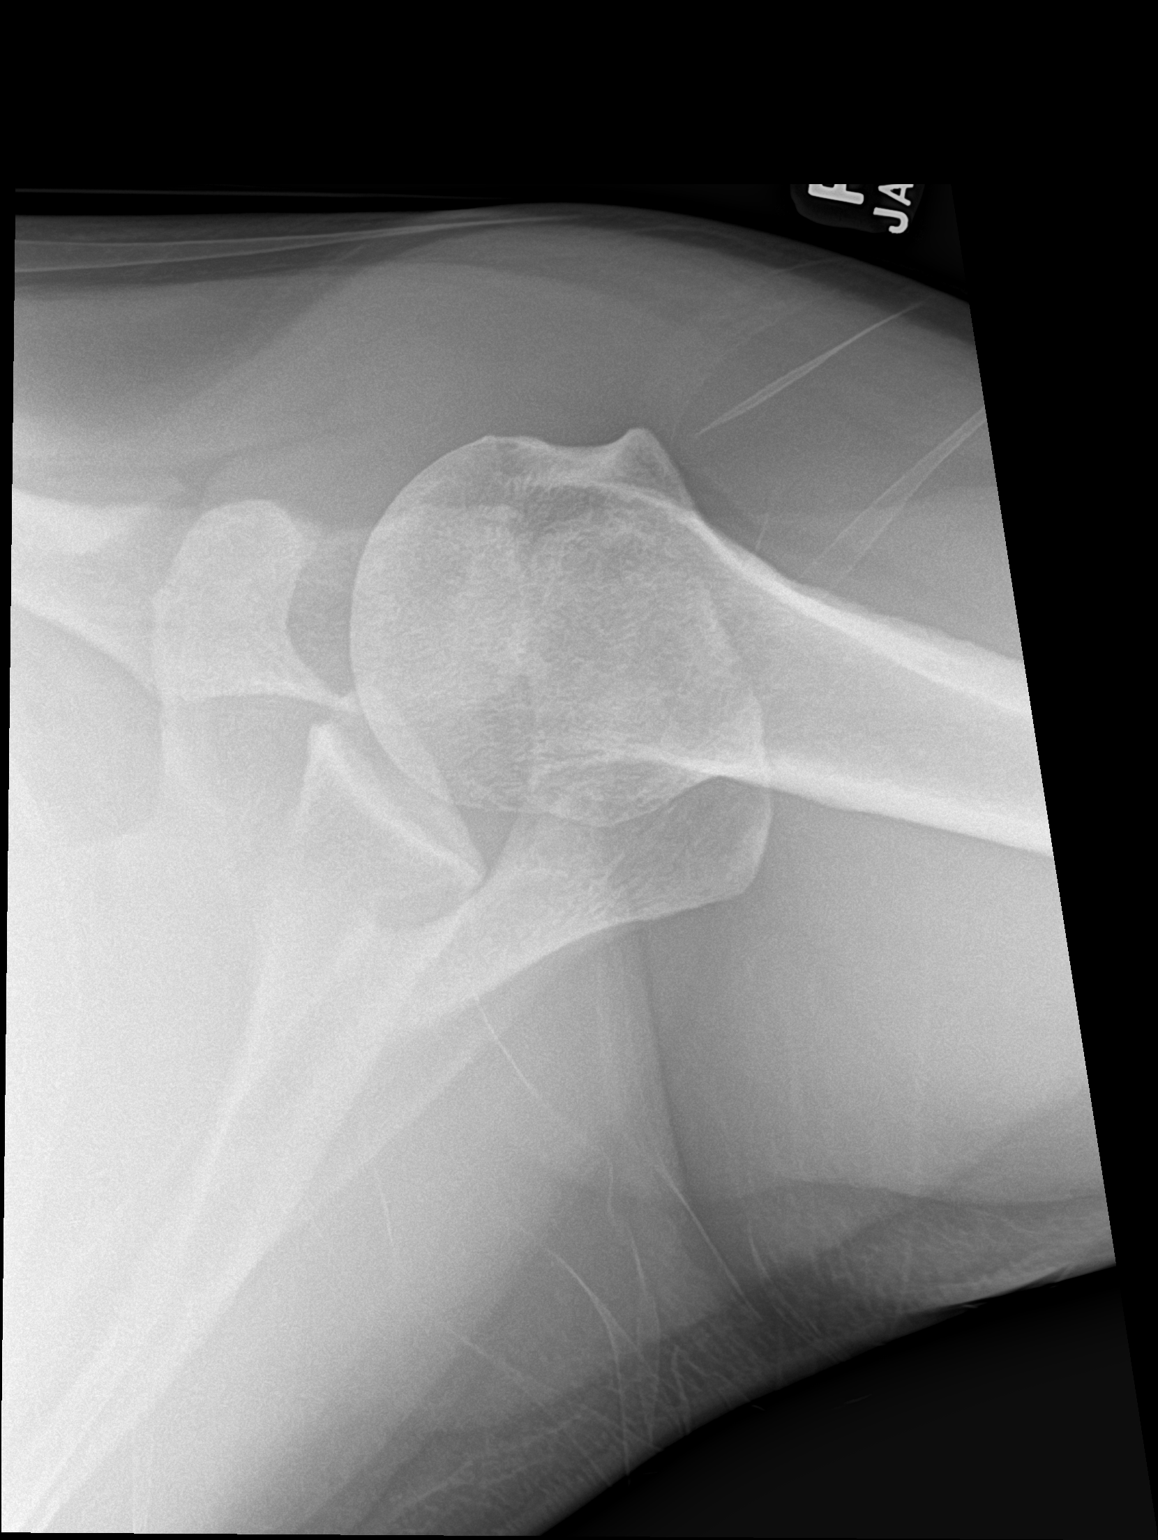

[3 of 3 positions shown; findings below may reference images not displayed]

FINDINGS: There is no evidence of fracture or dislocation. There is no
evidence of arthropathy or other focal bone abnormality. Soft
tissues are unremarkable.
IMPRESSION: Negative.

## 2023-09-08 ENCOUNTER — Encounter: Payer: BC Managed Care – PPO | Admitting: Urology

## 2023-09-29 ENCOUNTER — Ambulatory Visit: Payer: BC Managed Care – PPO | Admitting: Urology

## 2023-09-29 ENCOUNTER — Encounter: Payer: Self-pay | Admitting: Urology

## 2023-09-29 VITALS — BP 137/94 | HR 79 | Ht 73.0 in | Wt 240.0 lb

## 2023-09-29 DIAGNOSIS — Z3009 Encounter for other general counseling and advice on contraception: Secondary | ICD-10-CM | POA: Diagnosis not present

## 2023-09-29 MED ORDER — ALPRAZOLAM 1 MG PO TABS: ORAL_TABLET | ORAL | 0 refills | Status: DC

## 2023-09-29 NOTE — Progress Notes (Signed)
 Assessment: 1. Encounter for vasectomy assessment     Plan: Schedule for vasectomy per patient request Rx for alprazolam  1 mg pre-procedure provided. He request removal of scrotal skin tag at time of vasectomy.   Chief Complaint:  Chief Complaint  Patient presents with   VAS Consult    History of Present Illness:  Frank Sullivan is a 43 y.o. male who is seen for vasectomy evaluation. He is single with 2 children.  No history of scrotal trauma or infection.  Past Medical History:  Past Medical History:  Diagnosis Date   No known health problems     Past Surgical History:  Past Surgical History:  Procedure Laterality Date   None      Allergies:  No Known Allergies  Family History:  Family History  Problem Relation Age of Onset   Stroke Maternal Grandmother    Stroke Maternal Uncle     Social History:  Social History   Tobacco Use   Smoking status: Former    Types: Cigars   Smokeless tobacco: Never  Vaping Use   Vaping status: Never Used  Substance Use Topics   Alcohol use: Yes    Alcohol/week: 2.0 standard drinks of alcohol    Types: 2 Standard drinks or equivalent per week    Comment: weekly   Drug use: Never    Review of symptoms:  Constitutional:  Negative for unexplained weight loss, night sweats, fever, chills ENT:  Negative for nose bleeds, sinus pain, painful swallowing CV:  Negative for chest pain, shortness of breath, exercise intolerance, palpitations, loss of consciousness Resp:  Negative for cough, wheezing, shortness of breath GI:  Negative for nausea, vomiting, diarrhea, bloody stools GU:  Positives noted in HPI; otherwise negative for gross hematuria, dysuria, urinary incontinence Neuro:  Negative for seizures, poor balance, limb weakness, slurred speech Psych:  Negative for lack of energy, depression, anxiety Endocrine:  Negative for polydipsia, polyuria, symptoms of hypoglycemia (dizziness, hunger, sweating) Hematologic:   Negative for anemia, purpura, petechia, prolonged or excessive bleeding, use of anticoagulants  Allergic:  Negative for difficulty breathing or choking as a result of exposure to anything; no shellfish allergy; no allergic response (rash/itch) to materials, foods  Physical exam: BP (!) 137/94   Pulse 79   Ht 6' 1 (1.854 m)   Wt 240 lb (108.9 kg)   BMI 31.66 kg/m  GENERAL APPEARANCE:  Well appearing, well developed, well nourished, NAD HEENT:  Atraumatic, normocephalic, oropharynx clear NECK:  Supple without lymphadenopathy or thyromegaly ABDOMEN:  Soft, non-tender, no masses EXTREMITIES:  Moves all extremities well, without clubbing, cyanosis, or edema NEUROLOGIC:  Alert and oriented x 3, normal gait, CN II-XII grossly intact MENTAL STATUS:  appropriate BACK:  Non-tender to palpation, No CVAT SKIN:  Warm, dry, and intact GU: Penis:  circumcised Meatus: Normal Scrotum: small skin tag on anterior scrotum; vas palpated bilaterally, thick cords bilaterally Testis: normal without masses bilateral Epididymis: normal  Results: None  VASECTOMY CONSULTATION  Frank Sullivan presents for vasectomy consultation today.  He is a 43 y.o. male, Single with 2  children .  He and his wife have discussed the issues regarding long-term fertility and are comfortable with this decision.  He presents for consideration for vasectomy.  I discussed the issues in detail with him today and he expressed no reservations.  As to the procedure, no scalpel technique vasectomy is explained and reviewed in detail.  Generalized risks including but not limited to bleeding, infection, orchalgia, testicular atrophy, epididymitis,  scrotal hematoma, and chronic pain are discussed.   Additionally, he understands that the possibility of vas recanalization following vasectomy is possible although rare.  Most importantly, the patient understands that he is not sterile initially and will need a semen analysis check to confirm  sterility such that no sperm are seen.  He is advised to avoid ejaculation for 10 days following the procedure.  The initial semen analysis will be checked in approximately 12 weeks and in some patients, several months may be required for clearance of all sperm.  He reports a clear understanding of the need for continued birth control until sterility is confirmed.  Otherwise, general issues regarding local anesthesia, prep, alprazolam  are discussed and he reports a clear understanding.

## 2023-09-29 NOTE — Patient Instructions (Signed)

## 2023-09-30 ENCOUNTER — Encounter: Payer: BC Managed Care – PPO | Admitting: Urology

## 2023-11-24 ENCOUNTER — Other Ambulatory Visit: Payer: Self-pay

## 2023-11-24 ENCOUNTER — Emergency Department (HOSPITAL_COMMUNITY): Admission: EM | Admit: 2023-11-24 | Discharge: 2023-11-24 | Disposition: A | Discharge status: Home / Self Care

## 2023-11-24 ENCOUNTER — Encounter (HOSPITAL_COMMUNITY): Payer: Self-pay

## 2023-11-24 ENCOUNTER — Emergency Department (HOSPITAL_COMMUNITY)

## 2023-11-24 DIAGNOSIS — R079 Chest pain, unspecified: Secondary | ICD-10-CM | POA: Insufficient documentation

## 2023-11-24 DIAGNOSIS — R0789 Other chest pain: Secondary | ICD-10-CM | POA: Diagnosis not present

## 2023-11-24 DIAGNOSIS — R9389 Abnormal findings on diagnostic imaging of other specified body structures: Secondary | ICD-10-CM | POA: Diagnosis not present

## 2023-11-24 LAB — CBC
HCT: 46.1 % (ref 39.0–52.0)
Hemoglobin: 16 g/dL (ref 13.0–17.0)
MCH: 30.9 pg (ref 26.0–34.0)
MCHC: 34.7 g/dL (ref 30.0–36.0)
MCV: 89.2 fL (ref 80.0–100.0)
Platelets: 298 10*3/uL (ref 150–400)
RBC: 5.17 MIL/uL (ref 4.22–5.81)
RDW: 13.2 % (ref 11.5–15.5)
WBC: 5.6 10*3/uL (ref 4.0–10.5)
nRBC: 0 % (ref 0.0–0.2)

## 2023-11-24 LAB — BASIC METABOLIC PANEL
Anion gap: 12 (ref 5–15)
BUN: 12 mg/dL (ref 6–20)
CO2: 26 mmol/L (ref 22–32)
Calcium: 9.5 mg/dL (ref 8.9–10.3)
Chloride: 99 mmol/L (ref 98–111)
Creatinine, Ser: 1.18 mg/dL (ref 0.61–1.24)
GFR, Estimated: >60 mL/min (ref >60)
Glucose, Bld: 102 mg/dL — ABNORMAL HIGH (ref 70–99)
Potassium: 4.5 mmol/L (ref 3.5–5.1)
Sodium: 137 mmol/L (ref 135–145)

## 2023-11-24 LAB — TROPONIN I (HIGH SENSITIVITY)
Troponin I (High Sensitivity): 4 ng/L (ref <18)
Troponin I (High Sensitivity): 3 ng/L (ref <18)

## 2023-11-24 NOTE — ED Provider Notes (Signed)
 [place] EMERGENCY DEPARTMENT AT [facility] Provider Note   CSN: [id number] Arrival date & time: 11/24/23  0559     History  Chief Complaint  Patient presents with   Chest Pain    Frank Sullivan is a 43 y.o. male.  This is a 43 year old male presenting emergency department for chest pain x 3 days.  Intermittent lasts for seconds.  Described as a pressure type sensation.  Resolved spontaneously.  No aggravating or alleviating factors.  Not worsened by food.  Low risk for PE based on Wells criteria   Chest Pain      Home Medications Prior to Admission medications   Not on File      Allergies    Patient has no known allergies.    Review of Systems   Review of Systems  Cardiovascular:  Positive for chest pain.    Physical Exam Updated Vital Signs BP 129/82 (BP Location: Right Arm)   Pulse 66   Temp 98.3 F (36.8 C) (Oral)   Resp 16   Ht 6\' 1"  (1.854 m)   Wt 108.9 kg   SpO2 100%   BMI 31.66 kg/m  Physical Exam Vitals and nursing note reviewed.  Constitutional:      General: He is not in acute distress.    Appearance: He is not toxic-appearing.  Eyes:     Pupils: Pupils are equal, round, and reactive to light.  Cardiovascular:     Rate and Rhythm: Normal rate and regular rhythm.  Pulmonary:     Effort: Pulmonary effort is normal.  Abdominal:     Palpations: Abdomen is soft.  Musculoskeletal:     Right lower leg: No edema.     Left lower leg: No edema.  Skin:    General: Skin is warm.     Capillary Refill: Capillary refill takes less than 2 seconds.  Neurological:     General: No focal deficit present.     Mental Status: He is alert.  Psychiatric:        Mood and Affect: Mood normal.        Behavior: Behavior normal.     ED Results / Procedures / Treatments   Labs (all labs ordered are listed, but only abnormal results are displayed) Labs Reviewed  BASIC METABOLIC PANEL - Abnormal; Notable for the following components:      Result  Value   Glucose, Bld 102 (*)    All other components within normal limits  CBC  TROPONIN I (HIGH SENSITIVITY)  TROPONIN I (HIGH SENSITIVITY)    EKG EKG Interpretation Date/Time:  Wednesday November 24 2023 06:05:33 EST Ventricular Rate:  69 PR Interval:  152 QRS Duration:  98 QT Interval:  384 QTC Calculation: 411 R Axis:   -6  Text Interpretation: Normal sinus rhythm Normal ECG No previous ECGs available Confirmed by Estanislado Pandy [phone]) on 11/24/2023 8:36:44 AM  Radiology DG Chest 2 View Result Date: 11/24/2023 CLINICAL DATA:  Chest pain. EXAM: CHEST - 2 VIEW COMPARISON:  PA and lateral chest 08/06/2021 FINDINGS: The heart size and mediastinal contours are within normal limits. Both lungs are clear with slight elevation of the right hemidiaphragm. The visualized skeletal structures are unremarkable. IMPRESSION: No active cardiopulmonary disease.  Stable chest. Electronically Signed   By: Almira Bar M.D.   On: 11/24/2023 07:20    Procedures Procedures    Medications Ordered in ED Medications - No data to display  ED Course/ Medical Decision Making/ A&P  Medical Decision Making This is a 43 year old male with no significant past medical history presenting to the emergency department for chest pain.  He is afebrile nontachycardic, slightly hypertensive on arrival, but improved without intervention.  On exam, well-appearing, clear lungs.  EKG appears to be normal sinus rhythm without ST segment changes to indicate ischemia on my independent interpretation.  Troponin negative x 2.  ACS unlikely.  He is low risk for PE based on Wells criteria; history also not consistent with PE.  He is nontachycardic and not hypoxic.  His chest x-ray without pneumonia, pneumothorax or widened mediastinum that would suggest dissection, also equal pulses.  He has no metabolic derangements, normal kidney function.  He has no fever tachycardia or leukocytosis to suggest  systemic infection.  He did note that sometimes when he burps it feels better.  Symptoms secondary to GERD?Marland Kitchen  He is otherwise low risk.  Discussed outpatient follow-up.  Agreeable to plan.  Stable for discharge at this time.  Amount and/or Complexity of Data Reviewed External Data Reviewed:     Details: Does not appear to have any provocative cardiac testing on record Labs: ordered. Radiology: ordered.  Risk Decision regarding hospitalization.          Final Clinical Impression(s) / ED Diagnoses Final diagnoses:  None    Rx / DC Orders ED Discharge Orders     None         Coral Spikes, DO 11/24/23 1029

## 2023-11-24 NOTE — ED Triage Notes (Signed)
 Complains of left chest pain for past couple days.  Denies cough sob heavy lifting or pain radiating anywhere.  Denies n/v/dizziness.

## 2023-11-24 NOTE — Discharge Instructions (Signed)
 Please follow-up with your primary doctor.  We also giving the number to a cardiologist that she can follow-up with.  Please call to schedule appointment.  Return immediately felt fevers, chills, chest pain, shortness of breath, lightheadedness, palpitations, passout or you develop any new or worsening symptoms that are concerning to you.

## 2024-01-20 ENCOUNTER — Ambulatory Visit: Admitting: Urology

## 2024-01-20 ENCOUNTER — Encounter: Payer: Self-pay | Admitting: Urology

## 2024-01-20 VITALS — BP 146/113 | HR 65 | Ht 76.0 in | Wt 240.0 lb

## 2024-01-20 DIAGNOSIS — Z302 Encounter for sterilization: Secondary | ICD-10-CM | POA: Diagnosis not present

## 2024-01-20 MED ORDER — CEPHALEXIN 500 MG PO CAPS
500.0000 mg | ORAL_CAPSULE | Freq: Three times a day (TID) | ORAL | 0 refills | Status: AC
Start: 2024-01-20 — End: 2024-01-23

## 2024-01-20 MED ORDER — HYDROCODONE-ACETAMINOPHEN 5-325 MG PO TABS: 1.0000 | ORAL_TABLET | Freq: Four times a day (QID) | ORAL | 0 refills | Status: DC | PRN

## 2024-01-20 NOTE — Patient Instructions (Signed)

## 2024-01-20 NOTE — Progress Notes (Signed)
   Assessment: 1. Encounter for vasectomy    Plan: Post vasectomy instructions given Rx sent. Post vasectomy semen analysis in 12 weeks  Chief Complaint:  Chief Complaint  Patient presents with   VAS    History of Present Illness:  Frank Sullivan is a 43 y.o. male who is seen for vasectomy. He is single with 2 children.  No history of scrotal trauma or infection.  Past Medical History:  Past Medical History:  Diagnosis Date   No known health problems     Past Surgical History:  Past Surgical History:  Procedure Laterality Date   None      Allergies:  No Known Allergies  Family History:  Family History  Problem Relation Age of Onset   Stroke Maternal Grandmother    Stroke Maternal Uncle     Social History:  Social History   Tobacco Use   Smoking status: Former    Types: Cigars   Smokeless tobacco: Never  Vaping Use   Vaping status: Never Used  Substance Use Topics   Alcohol use: Yes    Alcohol/week: 2.0 standard drinks of alcohol    Types: 2 Standard drinks or equivalent per week    Comment: weekly   Drug use: Never    ROS: Constitutional:  Negative for fever, chills, weight loss CV: Negative for chest pain, previous MI, hypertension Respiratory:  Negative for shortness of breath, wheezing, sleep apnea, frequent cough GI:  Negative for nausea, vomiting, bloody stool, GERD  Physical exam: BP (!) 146/113   Pulse 65   Ht 6\' 4"  (1.93 m)   Wt 240 lb (108.9 kg)   BMI 29.21 kg/m  GENERAL APPEARANCE:  Well appearing, well developed, well nourished, NAD HEENT:  Atraumatic, normocephalic, oropharynx clear NECK:  Supple without lymphadenopathy or thyromegaly ABDOMEN:  Soft, non-tender, no masses EXTREMITIES:  Moves all extremities well, without clubbing, cyanosis, or edema NEUROLOGIC:  Alert and oriented x 3, normal gait, CN II-XII grossly intact MENTAL STATUS:  appropriate BACK:  Non-tender to palpation, No CVAT SKIN:  Warm, dry, and  intact  Results: None  VASECTOMY PROCEDURE:  Erma Frierson presents for vasectomy following previous vasectomy consultation and permit is signed.  The patient's anterior scrotal wall is shaved and prepped with Betadine in standard sterile fashion.  1% lidocaine is used as local anesthetic in the scrotal and peri vasal tissue.  A standard median raphe punch incision is made and a no scalpel technique vasectomy is performed.  Bilateral vas are isolated from the peri vasal tissue and an approximately 1 cm segment of vas is excised.  Proximal and distal segments are internally cauterized with electric heat cautery. Interposition of perivasal tissue was performed.  Bilateral palpation confirms bilateral vasectomy defect and no significant bleeding or hematoma is identified.  Neosporin gauze dressing and a scrotal support are applied.    Disposition: Patient is discharged home with Rx for pain medication and antibiotics.  Patient is given routine vasectomy instructions.   Most importantly, he is instructed and cautioned again regarding the need for protected intercourse until such time that a single  negative semen analysis has been obtained.  The initial semen analysis will be checked in approximately 12  weeks.  The patient reports a clear understanding.  He will call with any interval questions or concerns.

## 2024-02-09 ENCOUNTER — Telehealth: Payer: Self-pay | Admitting: Urology

## 2024-02-09 NOTE — Telephone Encounter (Signed)
 Pt called concerned that he might have a infection at his incision site from his Vasectomy that he had on May 1st. He stated that when he cleans or wipes it there is yellow liquid coming out. Please advise.

## 2024-02-10 ENCOUNTER — Ambulatory Visit (INDEPENDENT_AMBULATORY_CARE_PROVIDER_SITE_OTHER): Admitting: Urology

## 2024-02-10 ENCOUNTER — Encounter: Payer: Self-pay | Admitting: Urology

## 2024-02-10 VITALS — BP 148/98 | HR 73 | Ht 73.0 in | Wt 240.0 lb

## 2024-02-10 DIAGNOSIS — Z9852 Vasectomy status: Secondary | ICD-10-CM

## 2024-02-10 MED ORDER — CEPHALEXIN 500 MG PO CAPS
500.0000 mg | ORAL_CAPSULE | Freq: Three times a day (TID) | ORAL | 0 refills | Status: AC
Start: 2024-02-10 — End: 2024-02-17

## 2024-02-10 NOTE — Progress Notes (Signed)
   Assessment: 1. Status post vasectomy     Plan: Local care to wound with hydroperoxide and antibiotic ointment. Cephalexin  500 mg 3 times daily x 7 days. Return as needed if no improvement with above.  Chief Complaint:  Chief Complaint  Patient presents with   Status post vasectomy     History of Present Illness:  Frank Sullivan is a 43 y.o. male who is seen for problems with incision following vasectomy. He underwent an uncomplicated vasectomy procedure in the office on 01/20/24. He has noted some drainage from the incision site in past few days.  The drainage has been clear to slightly cloudy.  He has some minimal discomfort associated with the incision.  No scrotal swelling or erythema.   Past Medical History:  Past Medical History:  Diagnosis Date   No known health problems     Past Surgical History:  Past Surgical History:  Procedure Laterality Date   None      Allergies:  No Known Allergies  Family History:  Family History  Problem Relation Age of Onset   Stroke Maternal Grandmother    Stroke Maternal Uncle     Social History:  Social History   Tobacco Use   Smoking status: Former    Types: Cigars   Smokeless tobacco: Never  Vaping Use   Vaping status: Never Used  Substance Use Topics   Alcohol use: Yes    Alcohol/week: 2.0 standard drinks of alcohol    Types: 2 Standard drinks or equivalent per week    Comment: weekly   Drug use: Never    ROS: Constitutional:  Negative for fever, chills, weight loss CV: Negative for chest pain, previous MI, hypertension Respiratory:  Negative for shortness of breath, wheezing, sleep apnea, frequent cough GI:  Negative for nausea, vomiting, bloody stool, GERD  Physical exam: BP (!) 148/98   Pulse 73   Ht 6\' 1"  (1.854 m)   Wt 240 lb (108.9 kg)   BMI 31.66 kg/m  GU: Vasectomy incision without erythema; minimal induration without obvious drainage or fluctuance  Results: None

## 2024-03-28 ENCOUNTER — Ambulatory Visit

## 2024-04-21 ENCOUNTER — Other Ambulatory Visit

## 2024-04-21 DIAGNOSIS — Z302 Encounter for sterilization: Secondary | ICD-10-CM

## 2024-04-22 ENCOUNTER — Ambulatory Visit: Payer: Self-pay | Admitting: Urology

## 2024-04-22 LAB — POST-VAS SPERM EVALUATION,QUAL: Volume: 2 mL

## 2024-06-19 ENCOUNTER — Ambulatory Visit: Admitting: Internal Medicine

## 2024-06-19 VITALS — BP 142/102 | HR 62 | Temp 98.2°F | Resp 16 | Ht 73.0 in | Wt 247.4 lb

## 2024-06-19 DIAGNOSIS — Z23 Encounter for immunization: Secondary | ICD-10-CM

## 2024-06-19 DIAGNOSIS — R0683 Snoring: Secondary | ICD-10-CM

## 2024-06-19 DIAGNOSIS — Z125 Encounter for screening for malignant neoplasm of prostate: Secondary | ICD-10-CM

## 2024-06-19 DIAGNOSIS — I10 Essential (primary) hypertension: Secondary | ICD-10-CM | POA: Diagnosis not present

## 2024-06-19 DIAGNOSIS — R739 Hyperglycemia, unspecified: Secondary | ICD-10-CM | POA: Diagnosis not present

## 2024-06-19 DIAGNOSIS — G4726 Circadian rhythm sleep disorder, shift work type: Secondary | ICD-10-CM

## 2024-06-19 DIAGNOSIS — Z0001 Encounter for general adult medical examination with abnormal findings: Secondary | ICD-10-CM

## 2024-06-19 DIAGNOSIS — Z0000 Encounter for general adult medical examination without abnormal findings: Secondary | ICD-10-CM | POA: Diagnosis not present

## 2024-06-19 LAB — CBC WITH DIFFERENTIAL/PLATELET
Basophils Absolute: 0 K/uL (ref 0.0–0.1)
Basophils Relative: 0.3 % (ref 0.0–3.0)
Eosinophils Absolute: 0.1 K/uL (ref 0.0–0.7)
Eosinophils Relative: 1.5 % (ref 0.0–5.0)
HCT: 44.4 % (ref 39.0–52.0)
Hemoglobin: 15.2 g/dL (ref 13.0–17.0)
Lymphocytes Relative: 41.1 % (ref 12.0–46.0)
Lymphs Abs: 2.6 K/uL (ref 0.7–4.0)
MCHC: 34.4 g/dL (ref 30.0–36.0)
MCV: 89 fl (ref 78.0–100.0)
Monocytes Absolute: 0.6 K/uL (ref 0.1–1.0)
Monocytes Relative: 10.1 % (ref 3.0–12.0)
Neutro Abs: 3 K/uL (ref 1.4–7.7)
Neutrophils Relative %: 47 % (ref 43.0–77.0)
Platelets: 252 K/uL (ref 150.0–400.0)
RBC: 4.98 Mil/uL (ref 4.22–5.81)
RDW: 13.5 % (ref 11.5–15.5)
WBC: 6.3 K/uL (ref 4.0–10.5)

## 2024-06-19 LAB — URINALYSIS, ROUTINE W REFLEX MICROSCOPIC
Bilirubin Urine: NEGATIVE
Hgb urine dipstick: NEGATIVE
Ketones, ur: NEGATIVE
Leukocytes,Ua: NEGATIVE
Nitrite: NEGATIVE
RBC / HPF: NONE SEEN (ref 0–?)
Specific Gravity, Urine: 1.02 (ref 1.000–1.030)
Total Protein, Urine: NEGATIVE
Urine Glucose: NEGATIVE
Urobilinogen, UA: 0.2 (ref 0.0–1.0)
pH: 6 (ref 5.0–8.0)

## 2024-06-19 LAB — LIPID PANEL
Cholesterol: 225 mg/dL — ABNORMAL HIGH (ref 0–200)
HDL: 30 mg/dL — ABNORMAL LOW (ref >39.00)
LDL Cholesterol: 130 mg/dL — ABNORMAL HIGH (ref 0–99)
NonHDL: 195
Total CHOL/HDL Ratio: 8
Triglycerides: 327 mg/dL — ABNORMAL HIGH (ref 0.0–149.0)
VLDL: 65.4 mg/dL — ABNORMAL HIGH (ref 0.0–40.0)

## 2024-06-19 LAB — BASIC METABOLIC PANEL WITH GFR
BUN: 11 mg/dL (ref 6–23)
CO2: 31 meq/L (ref 19–32)
Calcium: 9.6 mg/dL (ref 8.4–10.5)
Chloride: 100 meq/L (ref 96–112)
Creatinine, Ser: 0.98 mg/dL (ref 0.40–1.50)
GFR: 95.7 mL/min (ref >60.00)
Glucose, Bld: 95 mg/dL (ref 70–99)
Potassium: 4 meq/L (ref 3.5–5.1)
Sodium: 137 meq/L (ref 135–145)

## 2024-06-19 LAB — HEPATIC FUNCTION PANEL
ALT: 13 U/L (ref 0–53)
AST: 20 U/L (ref 0–37)
Albumin: 4.4 g/dL (ref 3.5–5.2)
Alkaline Phosphatase: 53 U/L (ref 39–117)
Bilirubin, Direct: 0.1 mg/dL (ref 0.0–0.3)
Total Bilirubin: 0.6 mg/dL (ref 0.2–1.2)
Total Protein: 8 g/dL (ref 6.0–8.3)

## 2024-06-19 LAB — HEMOGLOBIN A1C: Hgb A1c MFr Bld: 5.9 % (ref 4.6–6.5)

## 2024-06-19 NOTE — Patient Instructions (Signed)
 Health Maintenance, Male  Adopting a healthy lifestyle and getting preventive care are important in promoting health and wellness. Ask your health care provider about:  The right schedule for you to have regular tests and exams.  Things you can do on your own to prevent diseases and keep yourself healthy.  What should I know about diet, weight, and exercise?  Eat a healthy diet    Eat a diet that includes plenty of vegetables, fruits, low-fat dairy products, and lean protein.  Do not eat a lot of foods that are high in solid fats, added sugars, or sodium.  Maintain a healthy weight  Body mass index (BMI) is a measurement that can be used to identify possible weight problems. It estimates body fat based on height and weight. Your health care provider can help determine your BMI and help you achieve or maintain a healthy weight.  Get regular exercise  Get regular exercise. This is one of the most important things you can do for your health. Most adults should:  Exercise for at least 150 minutes each week. The exercise should increase your heart rate and make you sweat (moderate-intensity exercise).  Do strengthening exercises at least twice a week. This is in addition to the moderate-intensity exercise.  Spend less time sitting. Even light physical activity can be beneficial.  Watch cholesterol and blood lipids  Have your blood tested for lipids and cholesterol at 41 years of age, then have this test every 5 years.  You may need to have your cholesterol levels checked more often if:  Your lipid or cholesterol levels are high.  You are older than 41 years of age.  You are at high risk for heart disease.  What should I know about cancer screening?  Many types of cancers can be detected early and may often be prevented. Depending on your health history and family history, you may need to have cancer screening at various ages. This may include screening for:  Colorectal cancer.  Prostate cancer.  Skin cancer.  Lung  cancer.  What should I know about heart disease, diabetes, and high blood pressure?  Blood pressure and heart disease  High blood pressure causes heart disease and increases the risk of stroke. This is more likely to develop in people who have high blood pressure readings or are overweight.  Talk with your health care provider about your target blood pressure readings.  Have your blood pressure checked:  Every 3-5 years if you are 41-52 years of age.  Every year if you are 3 years old or older.  If you are between the ages of 60 and 72 and are a current or former smoker, ask your health care provider if you should have a one-time screening for abdominal aortic aneurysm (AAA).  Diabetes  Have regular diabetes screenings. This checks your fasting blood sugar level. Have the screening done:  Once every three years after age 41 if you are at a normal weight and have a low risk for diabetes.  More often and at a younger age if you are overweight or have a high risk for diabetes.  What should I know about preventing infection?  Hepatitis B  If you have a higher risk for hepatitis B, you should be screened for this virus. Talk with your health care provider to find out if you are at risk for hepatitis B infection.  Hepatitis C  Blood testing is recommended for:  Everyone born from [birth year] through 1963.  Anyone  with known risk factors for hepatitis C.  Sexually transmitted infections (STIs)  You should be screened each year for STIs, including gonorrhea and chlamydia, if:  You are sexually active and are younger than 41 years of age.  You are older than 41 years of age and your health care provider tells you that you are at risk for this type of infection.  Your sexual activity has changed since you were last screened, and you are at increased risk for chlamydia or gonorrhea. Ask your health care provider if you are at risk.  Ask your health care provider about whether you are at high risk for HIV. Your health care provider  may recommend a prescription medicine to help prevent HIV infection. If you choose to take medicine to prevent HIV, you should first get tested for HIV. You should then be tested every 3 months for as long as you are taking the medicine.  Follow these instructions at home:  Alcohol use  Do not drink alcohol if your health care provider tells you not to drink.  If you drink alcohol:  Limit how much you have to 0-2 drinks a day.  Know how much alcohol is in your drink. In the [place] one drink equals one 12 oz bottle of beer (355 mL), one 5 oz glass of wine (148 mL), or one 1 oz glass of hard liquor (44 mL).  Lifestyle  Do not use any products that contain nicotine or tobacco. These products include cigarettes, chewing tobacco, and vaping devices, such as e-cigarettes. If you need help quitting, ask your health care provider.  Do not use street drugs.  Do not share needles.  Ask your health care provider for help if you need support or information about quitting drugs.  General instructions  Schedule regular health, dental, and eye exams.  Stay current with your vaccines.  Tell your health care provider if:  You often feel depressed.  You have ever been abused or do not feel safe at home.  Summary  Adopting a healthy lifestyle and getting preventive care are important in promoting health and wellness.  Follow your health care provider's instructions about healthy diet, exercising, and getting tested or screened for diseases.  Follow your health care provider's instructions on monitoring your cholesterol and blood pressure.  This information is not intended to replace advice given to you by your health care provider. Make sure you discuss any questions you have with your health care provider.  Document Revised: 01/27/2021 Document Reviewed: 01/27/2021  Elsevier Patient Education  2024 [employer].

## 2024-06-19 NOTE — Progress Notes (Signed)
 "  Subjective:  Patient ID: Frank Sullivan, male    DOB: Feb 06, 1981  Age: 43 y.o. MRN: [id number]  CC: Annual Exam and Hypertension   HPI Frank Sullivan presents for a CPX and f/up -----  Discussed the use of AI scribe software for clinical note transcription with the patient, who gave verbal consent to proceed.  History of Present Illness Frank Sullivan is a 43 year old male who presents with post-vasectomy discomfort and increased urination.  He underwent a vasectomy in February or March of this year and has since experienced discomfort in the testicular area, described as similar to the sensation of being hit in the testicles, occasionally radiating to the stomach. No actual testicular pain, abdominal pain, nausea, or vomiting is reported.  He has noticed increased frequency of urination, urinating every couple of hours without increased fluid intake. There are no changes in appetite or excessive thirst. He has not been monitoring his blood sugar or blood pressure regularly, but was informed that his blood pressure was not optimal. He has no known history of hypertension.  He is concerned about weight management and acknowledges snoring, which has been noted by his daughter. He has not undergone a sleep study and denies any history of sleep apnea or being told he stops breathing at night.  He drinks alcohol infrequently, about twice a month, and does not smoke cigarettes. He is unsure about his family history of hypertension but mentions that his grandmother might have had it. His father is deceased, but he is unaware of his medical history. His mother is alive and well, and he has a brother who is also in good health.  EKG was normal earlier this year.   Outpatient Medications Prior to Visit  Medication Sig Dispense Refill   HYDROcodone -acetaminophen  (NORCO/VICODIN) 5-325 MG tablet Take 1 tablet by mouth every 6 (six) hours as needed. (Patient not taking: Reported on 02/10/2024) 10  tablet 0   No facility-administered medications prior to visit.    ROS Review of Systems  Constitutional:  Positive for unexpected weight change (wt gain). Negative for appetite change, chills, diaphoresis and fatigue.  HENT: Negative.    Eyes: Negative.   Respiratory: Negative.  Negative for cough, chest tightness, shortness of breath and wheezing.   Cardiovascular:  Negative for chest pain, palpitations and leg swelling.  Gastrointestinal:  Negative for abdominal pain, constipation, diarrhea, nausea and vomiting.  Endocrine: Positive for polyuria.  Genitourinary: Negative.  Negative for difficulty urinating, hematuria, penile swelling, scrotal swelling, testicular pain and urgency.  Musculoskeletal: Negative.  Negative for arthralgias, joint swelling and myalgias.  Skin: Negative.   Neurological:  Negative for dizziness and weakness.  Hematological:  Negative for adenopathy. Does not bruise/bleed easily.  Psychiatric/Behavioral:  Positive for sleep disturbance. Negative for confusion, decreased concentration and dysphoric mood.     Objective:  BP (!) 142/102 (BP Location: Left Arm, Patient Position: Sitting, Cuff Size: Normal) Comment: BP (R) 138/96  Pulse 62   Temp 98.2 F (36.8 C) (Oral)   Resp 16   Ht 6' 1 (1.854 m)   Wt 247 lb 6.4 oz (112.2 kg)   SpO2 96%   BMI 32.64 kg/m   BP Readings from Last 3 Encounters:  06/19/24 (!) 142/102  02/10/24 (!) 148/98  01/20/24 (!) 146/113    Wt Readings from Last 3 Encounters:  06/19/24 247 lb 6.4 oz (112.2 kg)  02/10/24 240 lb (108.9 kg)  01/20/24 240 lb (108.9 kg)    Physical Exam Vitals  reviewed.  Constitutional:      Appearance: Normal appearance.  HENT:     Nose: Nose normal.     Mouth/Throat:     Mouth: Mucous membranes are moist.  Eyes:     General: No scleral icterus.    Conjunctiva/sclera: Conjunctivae normal.  Cardiovascular:     Rate and Rhythm: Normal rate and regular rhythm.     Heart sounds: No murmur  heard.    No friction rub. No gallop.  Pulmonary:     Effort: Pulmonary effort is normal.     Breath sounds: No stridor. No wheezing, rhonchi or rales.  Abdominal:     General: Abdomen is flat.     Palpations: There is no mass.     Tenderness: There is no abdominal tenderness. There is no guarding.     Hernia: No hernia is present. There is no hernia in the left inguinal area or right inguinal area.  Genitourinary:    Pubic Area: No rash.      Penis: Normal.      Testes: Normal.        Right: Mass, tenderness or swelling not present.        Left: Mass, tenderness or swelling not present.     Epididymis:     Right: Normal. Not inflamed or enlarged. No mass.     Left: Normal. Not inflamed or enlarged. No mass.     Prostate: Normal. Not enlarged, not tender and no nodules present.     Rectum: Normal. Guaiac result negative. No mass, tenderness, anal fissure, external hemorrhoid or internal hemorrhoid. Normal anal tone.  Musculoskeletal:        General: Normal range of motion.     Cervical back: Neck supple.     Right lower leg: No edema.     Left lower leg: No edema.  Lymphadenopathy:     Cervical: No cervical adenopathy.     Lower Body: No right inguinal adenopathy. No left inguinal adenopathy.  Skin:    General: Skin is warm and dry.  Neurological:     General: No focal deficit present.     Mental Status: He is alert.  Psychiatric:        Mood and Affect: Mood normal.        Behavior: Behavior normal.     Lab Results  Component Value Date   WBC 6.3 06/19/2024   HGB 15.2 06/19/2024   HCT 44.4 06/19/2024   PLT 252.0 06/19/2024   GLUCOSE 95 06/19/2024   CHOL 225 (H) 06/19/2024   TRIG 327.0 (H) 06/19/2024   HDL 30.00 (L) 06/19/2024   LDLDIRECT 124.0 04/09/2022   LDLCALC 130 (H) 06/19/2024   ALT 13 06/19/2024   AST 20 06/19/2024   NA 137 06/19/2024   K 4.0 06/19/2024   CL 100 06/19/2024   CREATININE 0.98 06/19/2024   BUN 11 06/19/2024   CO2 31 06/19/2024   TSH  1.96 06/19/2024   PSA 1.33 06/19/2024   HGBA1C 5.9 06/19/2024    DG Chest 2 View Result Date: 11/24/2023 CLINICAL DATA:  Chest pain. EXAM: CHEST - 2 VIEW COMPARISON:  PA and lateral chest 08/06/2021 FINDINGS: The heart size and mediastinal contours are within normal limits. Both lungs are clear with slight elevation of the right hemidiaphragm. The visualized skeletal structures are unremarkable. IMPRESSION: No active cardiopulmonary disease.  Stable chest. Electronically Signed   By: Francis Quam M.D.   On: 11/24/2023 07:20    Assessment & Plan:  Encounter for general adult medical examination with abnormal findings- Exam completed, labs reviewed (statin is not indicated), vaccines reviewed and updated, cancer screenings addressed, pt ed material was given.  -     Lipid panel; Future -     PSA; Future  Primary hypertension- He has not achieved his BP goal. -     TSH; Future -     Urinalysis, Routine w reflex microscopic; Future -     Hepatic function panel; Future -     CBC with Differential/Platelet; Future -     Basic metabolic panel with GFR; Future -     Aldosterone + renin activity w/ ratio; Future -     Triamterene -HCTZ; Take 1 each (1 capsule total) by mouth daily.  Dispense: 90 capsule; Refill: 0 -     amLODIPine  Besylate; Take 1 tablet (5 mg total) by mouth daily.  Dispense: 90 tablet; Refill: 0 -     AMB Referral VBCI Care Management  Loud snoring -     Pulmonary Visit  Immunization due -     Tdap vaccine greater than or equal to 7yo IM  Need for immunization against influenza -     Flu vaccine trivalent PF, 6mos and older(Flulaval,Afluria,Fluarix,Fluzone)  Chronic hyperglycemia -     Hemoglobin A1c; Future  Shift work sleep disorder -     Pulmonary Visit     Follow-up: Return in about 3 months (around 09/18/2024).  Debby Molt, MD "

## 2024-06-20 ENCOUNTER — Ambulatory Visit: Payer: Self-pay | Admitting: Internal Medicine

## 2024-06-20 LAB — TSH: TSH: 1.96 u[IU]/mL (ref 0.35–5.50)

## 2024-06-20 LAB — PSA: PSA: 1.33 ng/mL (ref 0.10–4.00)

## 2024-06-20 MED ORDER — AMLODIPINE BESYLATE 5 MG PO TABS: 5.0000 mg | ORAL_TABLET | Freq: Every day | ORAL | 0 refills | Status: DC

## 2024-06-20 MED ORDER — TRIAMTERENE-HCTZ 37.5-25 MG PO CAPS: 1.0000 | ORAL_CAPSULE | Freq: Every day | ORAL | 0 refills | Status: DC

## 2024-06-23 ENCOUNTER — Telehealth: Payer: Self-pay | Admitting: *Deleted

## 2024-06-23 NOTE — Progress Notes (Signed)
 Care Guide Pharmacy Note  06/23/2024 Name: Frank Sullivan MRN: [id number] DOB: 21-Oct-1980  Referred By: Frank Debby CROME, MD Reason for referral: Call Attempt #1 and Complex Care Management (Outreach to schedule referral with pharmacist )   Frank Sullivan is a 43 y.o. year old male who is a primary care patient of Frank Debby CROME, MD.  Frank Sullivan was referred to the pharmacist for assistance related to: HTN  An unsuccessful telephone outreach was attempted today to contact the patient who was referred to the pharmacy team for assistance with medication management. Additional attempts will be made to contact the patient.  Frank Sullivan, CMA [employer]  [facility], [facility] Guide Direct Dial: [phone]  Fax: [phone] Website: [email or web address]

## 2024-06-26 NOTE — Progress Notes (Unsigned)
 Care Guide Pharmacy Note  06/26/2024 Name: Macarthur Lorusso MRN: [id number] DOB: 1981-02-15  Referred By: Joshua Debby CROME, MD Reason for referral: Call Attempt #1 and Complex Care Management (Outreach to schedule referral with pharmacist )   Branon Sabine is a 43 y.o. year old male who is a primary care patient of Joshua Debby CROME, MD.  Jahking Lesser was referred to the pharmacist for assistance related to: HTN  A second unsuccessful telephone outreach was attempted today to contact the patient who was referred to the pharmacy team for assistance with medication management. Additional attempts will be made to contact the patient.  [name], CMA [place]  [facility], [facility] Guide Direct Dial: [phone]  Fax: [phone] Website: [email or web address]

## 2024-06-27 NOTE — Progress Notes (Signed)
 Care Guide Pharmacy Note  06/27/2024 Name: Frank Sullivan MRN: [id number] DOB: 05/01/1981  Referred By: Joshua Debby CROME, MD Reason for referral: Call Attempt #1 and Complex Care Management (Outreach to schedule referral with pharmacist )   [name] is a 43 y.o. year old male who is a primary care patient of Joshua Debby CROME, MD.  [name] was referred to the pharmacist for assistance related to: HTN  A third unsuccessful telephone outreach was attempted today to contact the patient who was referred to the pharmacy team for assistance with medication management. The Population Health team is pleased to engage with this patient at any time in the future upon receipt of referral and should he/she be interested in assistance from the Population Health team.  [name], CMA [facility]  [facility], [facility] Guide Direct Dial: [phone]  Fax: [phone] Website: [email or web address]

## 2024-06-28 ENCOUNTER — Ambulatory Visit: Admitting: Internal Medicine

## 2024-07-05 LAB — ALDOSTERONE + RENIN ACTIVITY W/ RATIO
ALDO / PRA Ratio: 6.4 ratio (ref 0.9–28.9)
Aldosterone: 3 ng/dL
Renin Activity: 0.47 ng/mL/h (ref 0.25–5.82)

## 2024-07-27 ENCOUNTER — Encounter (HOSPITAL_BASED_OUTPATIENT_CLINIC_OR_DEPARTMENT_OTHER): Payer: Self-pay | Admitting: Pulmonary Disease

## 2024-07-27 ENCOUNTER — Ambulatory Visit (INDEPENDENT_AMBULATORY_CARE_PROVIDER_SITE_OTHER): Admitting: Pulmonary Disease

## 2024-07-27 VITALS — BP 124/86 | HR 71 | Ht 73.0 in | Wt 244.0 lb

## 2024-07-27 DIAGNOSIS — G4733 Obstructive sleep apnea (adult) (pediatric): Secondary | ICD-10-CM

## 2024-07-27 DIAGNOSIS — R0683 Snoring: Secondary | ICD-10-CM | POA: Diagnosis not present

## 2024-07-27 MED ORDER — MOMETASONE FUROATE 50 MCG/ACT NA SUSP: NASAL | 11 refills | Status: AC

## 2024-07-27 NOTE — Patient Instructions (Signed)
  VISIT SUMMARY: Today, you were seen for concerns related to sleep apnea, snoring, and multiple nighttime awakenings. You were referred by Dr. Debby Molt for further evaluation. We discussed your symptoms, including snoring, nighttime awakenings, and your history of hypertension. We also reviewed your family history of sleep apnea and your current sleep habits.  YOUR PLAN: -SUSPECTED OBSTRUCTIVE SLEEP APNEA WITH SNORING: Obstructive sleep apnea is a condition where the airway becomes blocked during sleep, causing breathing pauses. Your symptoms include snoring and waking up at night. We discussed that not all snoring means sleep apnea, but your narrow airway increases the risk. A home sleep test has been ordered to check for airway obstruction and oxygen levels during sleep. If sleep apnea is confirmed, a CPAP machine may be beneficial. For now, you have been prescribed a nasal spray with a steroid to use at bedtime to help with snoring.  -ENLARGED TONSILS AND SWOLLEN NASAL TURBINATES: Enlarged tonsils and swollen nasal turbinates can contribute to snoring and potential airway obstruction. Although you do not have significant nasal symptoms, these anatomical findings can impact your sleep quality. Surgery is not recommended at this time due to your age and current symptoms. The nasal spray with a steroid that has been prescribed may help reduce the swelling and improve airflow.  INSTRUCTIONS: Please complete the home sleep test as ordered to evaluate for obstructive sleep apnea. Use the prescribed nasal spray with a steroid, one spray in each nostril at bedtime. Follow up with us  after completing the sleep test to discuss the results and any further steps.                      Contains text generated by [employer]bridge.                                 Contains text generated by [employer]bridge.

## 2024-07-27 NOTE — Progress Notes (Signed)
 New Patient Pulmonology Office Visit   Subjective:  Patient ID: Frank Sullivan, male    DOB: Jun 24, 1981  MRN: [id number]  Referred by: Joshua Debby CROME, MD  CC:  Chief Complaint  Patient presents with  . Establish Care    New sleep     Discussed the use of AI scribe software for clinical note transcription with the patient, who gave verbal consent to proceed.  History of Present Illness Frank Sullivan is a 43 year old male who presents with sleep apnea, snoring, and multiple nighttime awakenings. He was referred by Dr. Debby Joshua for evaluation of sleep apnea.  He experiences snoring and multiple awakenings during the night, typically waking up between 3 and 4 AM to use the bathroom and again around 6:30 AM. These symptoms began when he started working night shifts approximately seven months ago. Transitioning to a second shift schedule has improved his daytime alertness.  He does not feel that his snoring is problematic, but it was a concern during his previous night shift work when he experienced daytime fatigue and headaches. He does not report excessive daytime sleepiness, except towards the end of his work shift.  He has a history of hypertension, with good blood pressure control on medication. His younger brother has been diagnosed with sleep apnea and uses a CPAP machine.  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 He generally feels rested upon waking. He acknowledges being told he has enlarged tonsils and that he sounds nasal, but does not report significant nasal congestion or obstruction.    ESS 4  ROS  Constitutional: negative for anorexia, fevers and sweats  Eyes: negative for irritation, redness and visual disturbance  Ears, nose, mouth, throat, and face: negative for earaches, epistaxis, nasal congestion and sore throat  Respiratory: negative for cough, dyspnea on exertion, sputum and wheezing  Cardiovascular: negative for chest pain,  dyspnea, lower extremity edema, orthopnea, palpitations and syncope  Gastrointestinal: negative for abdominal pain, constipation, diarrhea, melena, nausea and vomiting  Genitourinary:negative for dysuria, frequency and hematuria  Hematologic/lymphatic: negative for bleeding, easy bruising and lymphadenopathy  Musculoskeletal:negative for arthralgias, muscle weakness and stiff joints  Neurological: negative for coordination problems, gait problems, headaches and weakness  Endocrine: negative for diabetic symptoms including polydipsia, polyuria and weight loss   Allergies: Patient has no known allergies.  Current Outpatient Medications:  .  amLODipine (NORVASC) 5 MG tablet, Take 1 tablet (5 mg total) by mouth daily., Disp: 90 tablet, Rfl: 0 .  triamterene-hydrochlorothiazide (DYAZIDE) 37.5-25 MG capsule, Take 1 each (1 capsule total) by mouth daily., Disp: 90 capsule, Rfl: 0 Past Medical History:  Diagnosis Date  . No known health problems    Past Surgical History:  Procedure Laterality Date  . None     Family History  Problem Relation Age of Onset  . Stroke Maternal Grandmother   . Stroke Maternal Uncle    Social History   Socioeconomic History  . Marital status: Single    Spouse name: Not on file  . Number of children: Not on file  . Years of education: Not on file  . Highest education level: GED or equivalent  Occupational History  . Not on file  Tobacco Use  . Smoking status: Former    Types: Cigars  . Smokeless tobacco: Never  Vaping Use  . Vaping status: Never Used  Substance and Sexual Activity  . Alcohol use: Yes    Alcohol/week: 2.0 standard drinks of alcohol  Types: 2 Standard drinks or equivalent per week    Comment: weekly  . Drug use: Never  . Sexual activity: Yes    Partners: Female  Other Topics Concern  . Not on file  Social History Narrative  . Not on file   Social Drivers of Health   Financial Resource Strain: High Risk (06/19/2024)    Overall Financial Resource Strain (CARDIA)   . Difficulty of Paying Living Expenses: Very hard  Food Insecurity: Food Insecurity Present (06/19/2024)   Hunger Vital Sign   . Worried About Programme Researcher, Broadcasting/film/video in the Last Year: Often true   . Ran Out of Food in the Last Year: Sometimes true  Transportation Needs: No Transportation Needs (06/19/2024)   PRAPARE - Transportation   . Lack of Transportation (Medical): No   . Lack of Transportation (Non-Medical): No  Physical Activity: Insufficiently Active (06/19/2024)   Exercise Vital Sign   . Days of Exercise per Week: 2 days   . Minutes of Exercise per Session: 20 min  Stress: Stress Concern Present (06/19/2024)   [employer] of Occupational Health - Occupational Stress Questionnaire   . Feeling of Stress: Very much  Social Connections: Unknown (06/19/2024)   Social Connection and Isolation Panel   . Frequency of Communication with Friends and Family: Once a week   . Frequency of Social Gatherings with Friends and Family: Patient declined   . Attends Religious Services: Never   . Active Member of Clubs or Organizations: No   . Attends Banker Meetings: Not on file   . Marital Status: Never married  Intimate Partner Violence: Not on file       Objective:  BP 124/86   Pulse 71   Ht 6' 1 (1.854 m)   Wt 244 lb (110.7 kg)   SpO2 96%   BMI 32.19 kg/m    Physical Exam  Gen. Pleasant, well-nourished, in no distress ENT -swollen nasal turbinates , class 2-3 airwayno pallor/icterus,no post nasal drip Neck: No JVD, no thyromegaly, no carotid bruits Lungs: no use of accessory muscles, no dullness to percussion, clear without rales or rhonchi  Cardiovascular: Rhythm regular, heart sounds  normal, no murmurs or gallops, no peripheral edema Musculoskeletal: No deformities, no cyanosis or clubbing        Assessment & Plan:  Assessment and Plan Assessment & Plan Suspected obstructive sleep apnea with snoring Suspected  obstructive sleep apnea due to snoring and narrow airway. Symptoms include snoring and nocturnal awakenings. No significant daytime sleepiness reported. Differential includes simple snoring versus obstructive sleep apnea. Family history of sleep apnea in younger brother. Discussed potential for mild sleep apnea based on airway examination. Explained that not all snoring indicates sleep apnea, but a narrow airway increases risk. Discussed potential benefits of CPAP if sleep apnea is confirmed, but current symptoms do not warrant immediate intervention. Explained that the home sleep test will assess for airway obstruction and oxygen desaturation during sleep. - Ordered home sleep test to evaluate for obstructive sleep apnea. - Prescribed nasal spray with steroid for snoring, one spray each nostril at bedtime.  Enlarged tonsils and swollen nasal turbinates Contributing to snoring and potential airway obstruction. No significant nasal symptoms reported. Discussed anatomical findings and potential impact on sleep quality. Surgery not recommended due to age and current symptomatology. Nasal spray may help reduce nasal turbinate swelling and improve airflow. - Prescribed nasal spray with steroid to reduce nasal turbinate swelling.       No follow-ups on  file.   Harden ROCKFORD Jude, MD

## 2024-07-27 NOTE — Progress Notes (Signed)
 Epworth Sleepiness Scale  Use the following scale to choose the most appropriate number for each situation. 0 Would never nod off 1  Slight  chance of nodding off 2 Moderate chance of nodding off 3 High chance of nodding off  Sitting and reading: 0 Watching TV: 0 Sitting, inactive, in a public place (e.g., in a meeting, theater, or dinner event): 1  As a passenger in a car for 1hour or more without stopping for a break: 0 Lying down to rest when circumstances permit:2 Sitting and talking to someone: 0 Sitting quietly after a meal without alcohol: 1 In a car, while stopped for a few  minutes in traffic or at a light: 0   TOTOAL: 4

## 2024-08-24 ENCOUNTER — Ambulatory Visit (HOSPITAL_BASED_OUTPATIENT_CLINIC_OR_DEPARTMENT_OTHER): Admitting: Pulmonary Disease

## 2024-09-19 ENCOUNTER — Ambulatory Visit: Admitting: Internal Medicine

## 2024-09-19 ENCOUNTER — Encounter: Payer: Self-pay | Admitting: Internal Medicine

## 2024-09-19 VITALS — BP 136/92 | HR 76 | Temp 98.4°F | Resp 16 | Ht 73.0 in | Wt 238.0 lb

## 2024-09-19 DIAGNOSIS — F4329 Adjustment disorder with other symptoms: Secondary | ICD-10-CM | POA: Insufficient documentation

## 2024-09-19 DIAGNOSIS — I10 Essential (primary) hypertension: Secondary | ICD-10-CM | POA: Diagnosis not present

## 2024-09-19 DIAGNOSIS — F411 Generalized anxiety disorder: Secondary | ICD-10-CM | POA: Insufficient documentation

## 2024-09-19 DIAGNOSIS — J069 Acute upper respiratory infection, unspecified: Secondary | ICD-10-CM | POA: Diagnosis not present

## 2024-09-19 MED ORDER — TRIAMTERENE-HCTZ 37.5-25 MG PO CAPS: 1.0000 | ORAL_CAPSULE | Freq: Every day | ORAL | 0 refills | Status: AC

## 2024-09-19 MED ORDER — AMLODIPINE BESYLATE 5 MG PO TABS: 5.0000 mg | ORAL_TABLET | Freq: Every day | ORAL | 0 refills | Status: AC

## 2024-09-19 NOTE — Patient Instructions (Signed)
 Viral Respiratory Infection A respiratory infection is an illness that affects part of the respiratory system, such as the lungs, nose, or throat. A respiratory infection that is caused by a virus is called a viral respiratory infection. Common types of viral respiratory infections include: A cold. The flu (influenza). A respiratory syncytial virus (RSV) infection. What are the causes? This condition is caused by a virus. The virus may spread through contact with droplets or direct contact with infected people or their mucus or secretions. The virus may spread from person to person (is contagious). What are the signs or symptoms? Symptoms of this condition include: A stuffy or runny nose. A sore throat or cough. Shortness of breath or difficulty breathing. Yellow or green mucus (sputum). Other symptoms may include: A fever. Sweating or chills. Fatigue. Achy muscles. A headache. How is this diagnosed? This condition may be diagnosed based on: Your symptoms. A physical exam. Testing of secretions from the nose or throat. Chest X-ray. How is this treated? This condition may be treated with medicines, such as: Antiviral medicine. This may shorten the length of time a person has symptoms. Expectorants. These make it easier to cough up mucus. Decongestant nasal sprays. Acetaminophen or NSAIDs, such as ibuprofen, to relieve fever and pain. Antibiotic medicines are not prescribed for viral infections.This is because antibiotics are designed to kill bacteria. They do not kill viruses. Follow these instructions at home: Managing pain and congestion Take over-the-counter and prescription medicines only as told by your health care provider. If you have a sore throat, gargle with a mixture of salt and water 3-4 times a day or as needed. To make salt water, completely dissolve -1 tsp (3-6 g) of salt in 1 cup (237 mL) of warm water. Use nose drops made from salt water to ease congestion and  soften raw skin around your nose. Take 2 tsp (10 mL) of honey at bedtime to lessen coughing at night. Do not give honey to children who are younger than 1 year. Drink enough fluid to keep your urine pale yellow. This helps prevent dehydration and helps loosen up mucus. General instructions  Rest as much as possible. Do not drink alcohol. Do not use any products that contain nicotine or tobacco. These products include cigarettes, chewing tobacco, and vaping devices, such as e-cigarettes. If you need help quitting, ask your health care provider. Keep all follow-up visits. This is important. How is this prevented?     Get an annual flu shot. You may get the flu shot in late summer, fall, or winter. Ask your health care provider when you should get your flu shot. Avoid spreading your infection to other people. If you are sick: Wash your hands with soap and water often, especially after you cough or sneeze. Wash for at least 20 seconds. If soap and water are not available, use alcohol-based hand sanitizer. Cover your mouth when you cough. Cover your nose and mouth when you sneeze. Do not share cups or eating utensils. Clean commonly used objects often. Clean commonly touched surfaces. Stay home from work or school as told by your health care provider. Avoid contact with people who are sick during cold and flu season. This is generally fall and winter. Contact a health care provider if: Your symptoms last for 10 days or longer. Your symptoms get worse over time. You have severe sinus pain in your face or forehead. The glands in your jaw or neck become very swollen. You have shortness of breath. Get  help right away if you: Feel pain or pressure in your chest. Have trouble breathing. Faint or feel like you will faint. Have severe and persistent vomiting. Feel confused or disoriented. These symptoms may represent a serious problem that is an emergency. Do not wait to see if the symptoms will  go away. Get medical help right away. Call your local emergency services (911 in the [place] Do not drive yourself to the hospital. Summary A respiratory infection is an illness that affects part of the respiratory system, such as the lungs, nose, or throat. A respiratory infection that is caused by a virus is called a viral respiratory infection. Common types of viral respiratory infections include a cold, influenza, and respiratory syncytial virus (RSV) infection. Symptoms of this condition include a stuffy or runny nose, cough, fatigue, achy muscles, sore throat, and fevers or chills. Antibiotic medicines are not prescribed for viral infections. This is because antibiotics are designed to kill bacteria. They are not effective against viruses. This information is not intended to replace advice given to you by your health care provider. Make sure you discuss any questions you have with your health care provider. Document Revised: 12/12/2020 Document Reviewed: 12/12/2020 Elsevier Patient Education  2024 [employer].

## 2024-09-19 NOTE — Progress Notes (Unsigned)
 "  Subjective:  Patient ID: Xxavier Noon, male    DOB: 1981-05-21  Age: 43 y.o. MRN: [id number]  CC: Hypertension (Following up on his BP after starting his medication ) and URI   HPI Yoskar Murrillo presents for f/up  Discussed the use of AI scribe software for clinical note transcription with the patient, who gave verbal consent to proceed.  History of Present Illness Cj Edgell is a 43 year old male who presents with a cough and anxiety.  He has been experiencing a productive cough that began around Christmas Eve, lasting for about a week. The sputum is yellowish and occasionally brown, but not dark brown. No hemoptysis. He has been using Alka-Seltzer Cold intermittently for symptom relief, which he confirms is a decongestant. No sensation of heart racing after taking the medication. He also reports night sweats. No fever, chills, shortness of breath, or wheezing, although he occasionally hears a sound he describes as 'phlegm or whatever'. No sore throat, headache, or blurred vision. Symptoms are improving.  He experiences anxiety, which he describes as 'almost always' present. He feels sad and mildly depressed due to recent life events, including the loss of his son and uncle, and his daughter's diagnosis of leukemia. No thoughts of self-harm or harm to others. He reports sleeping better since changing his work schedule but admits to being 'always worried' and 'a little paranoid'.  He mentions a concern about his blood pressure. He recalls having an EKG in the past year, possibly during an ER visit.  His family history is significant for his daughter's diagnosis of chronic myeloid leukemia, for which she takes a daily chemotherapy pill. He also reports the recent loss of his son due to an accidental gunshot and the loss of an uncle.  He recently changed his work shift, which has improved his sleep schedule. He now goes to bed around midnight and sleeps until 9 or 10  AM.     Outpatient Medications Prior to Visit  Medication Sig Dispense Refill   mometasone  (NASONEX ) 50 MCG/ACT nasal spray 1 spray each nare at bedtime 1 each 11   amLODipine  (NORVASC ) 5 MG tablet Take 1 tablet (5 mg total) by mouth daily. 90 tablet 0   triamterene -hydrochlorothiazide (DYAZIDE) 37.5-25 MG capsule Take 1 each (1 capsule total) by mouth daily. 90 capsule 0   No facility-administered medications prior to visit.    ROS Review of Systems  Constitutional:  Negative for appetite change, chills, diaphoresis, fatigue and fever.  HENT: Negative.  Negative for congestion, sinus pressure, sore throat, trouble swallowing and voice change.   Eyes: Negative.  Negative for visual disturbance.  Respiratory:  Positive for cough. Negative for chest tightness, shortness of breath, wheezing and stridor.   Cardiovascular:  Negative for chest pain, palpitations and leg swelling.  Gastrointestinal: Negative.  Negative for abdominal pain, constipation, diarrhea, nausea and vomiting.  Endocrine: Negative.   Genitourinary: Negative.  Negative for difficulty urinating.  Musculoskeletal: Negative.   Skin: Negative.   Neurological: Negative.  Negative for dizziness and weakness.  Hematological:  Negative for adenopathy. Does not bruise/bleed easily.  Psychiatric/Behavioral:  Positive for dysphoric mood. Negative for confusion, decreased concentration, sleep disturbance and suicidal ideas. The patient is nervous/anxious.     Objective:  BP (!) 136/92 (BP Location: Left Arm, Patient Position: Sitting, Cuff Size: Normal)   Pulse 76   Temp 98.4 F (36.9 C) (Oral)   Resp 16   Ht 6' 1 (1.854 m)   Wt 238  lb (108 kg)   SpO2 97%   BMI 31.40 kg/m   BP Readings from Last 3 Encounters:  09/19/24 (!) 136/92  07/27/24 124/86  06/19/24 (!) 142/102    Wt Readings from Last 3 Encounters:  09/19/24 238 lb (108 kg)  07/27/24 244 lb (110.7 kg)  06/19/24 247 lb 6.4 oz (112.2 kg)    Physical  Exam Vitals reviewed.  Constitutional:      General: He is not in acute distress.    Appearance: Normal appearance. He is not toxic-appearing or diaphoretic.  HENT:     Nose: Nose normal.     Mouth/Throat:     Mouth: Mucous membranes are moist.     Palate: No mass.     Pharynx: Oropharynx is clear. No oropharyngeal exudate.     Tonsils: No tonsillar exudate.  Eyes:     General: No scleral icterus.    Conjunctiva/sclera: Conjunctivae normal.  Cardiovascular:     Rate and Rhythm: Normal rate and regular rhythm.     Heart sounds: No murmur heard.    No gallop.  Pulmonary:     Effort: Pulmonary effort is normal.     Breath sounds: No stridor. No wheezing, rhonchi or rales.  Abdominal:     General: Abdomen is flat.     Palpations: There is no mass.     Tenderness: There is no abdominal tenderness. There is no guarding.     Hernia: No hernia is present.  Musculoskeletal:        General: Normal range of motion.     Cervical back: Neck supple.     Right lower leg: No edema.     Left lower leg: No edema.  Lymphadenopathy:     Cervical: No cervical adenopathy.  Skin:    General: Skin is warm and dry.     Findings: No rash.  Neurological:     General: No focal deficit present.     Mental Status: He is alert.  Psychiatric:        Attention and Perception: Attention and perception normal.        Mood and Affect: Mood is anxious.        Speech: Speech normal. Speech is not delayed or tangential.        Behavior: Behavior normal. Behavior is cooperative.        Thought Content: Thought content normal. Thought content is not paranoid or delusional. Thought content does not include homicidal or suicidal ideation.        Cognition and Memory: Cognition normal.     Lab Results  Component Value Date   WBC 6.3 06/19/2024   HGB 15.2 06/19/2024   HCT 44.4 06/19/2024   PLT 252.0 06/19/2024   GLUCOSE 95 06/19/2024   CHOL 225 (H) 06/19/2024   TRIG 327.0 (H) 06/19/2024   HDL 30.00 (L)  06/19/2024   LDLDIRECT 124.0 04/09/2022   LDLCALC 130 (H) 06/19/2024   ALT 13 06/19/2024   AST 20 06/19/2024   NA 137 06/19/2024   K 4.0 06/19/2024   CL 100 06/19/2024   CREATININE 0.98 06/19/2024   BUN 11 06/19/2024   CO2 31 06/19/2024   TSH 1.96 06/19/2024   PSA 1.33 06/19/2024   HGBA1C 5.9 06/19/2024    DG Chest 2 View Result Date: 11/24/2023 CLINICAL DATA:  Chest pain. EXAM: CHEST - 2 VIEW COMPARISON:  PA and lateral chest 08/06/2021 FINDINGS: The heart size and mediastinal contours are within normal limits. Both lungs are  clear with slight elevation of the right hemidiaphragm. The visualized skeletal structures are unremarkable. IMPRESSION: No active cardiopulmonary disease.  Stable chest. Electronically Signed   By: Francis Quam M.D.   On: 11/24/2023 07:20    Assessment & Plan:  GAD (generalized anxiety disorder) -     AMB Referral [facility] Care Management  Prolonged grief reaction -     AMB Referral [facility] Care Management  Primary hypertension- He has not achieved his BP goal. He agrees to stop taking a decongestant. -     amLODIPine  Besylate; Take 1 tablet (5 mg total) by mouth daily.  Dispense: 90 tablet; Refill: 0 -     Triamterene -HCTZ; Take 1 each (1 capsule total) by mouth daily.  Dispense: 90 capsule; Refill: 0  Viral URI with cough- Improving.     Follow-up: Return in about 6 months (around 03/20/2025).  Debby Molt, MD "

## 2024-09-20 DIAGNOSIS — J069 Acute upper respiratory infection, unspecified: Secondary | ICD-10-CM | POA: Insufficient documentation

## 2024-09-22 ENCOUNTER — Telehealth: Payer: Self-pay | Admitting: *Deleted

## 2024-09-22 NOTE — Progress Notes (Signed)
 Complex Care Management Note Care Guide Note  09/22/2024 Name: Frank Sullivan MRN: [id number] DOB: Jan 22, 1981   Complex Care Management Outreach Attempts: An unsuccessful telephone outreach was attempted today to offer the patient information about available complex care management services.  Follow Up Plan:  Additional outreach attempts will be made to offer the patient complex care management information and services.   Encounter Outcome:  No Answer  [name], CMA [place]  [facility], [facility] Guide Direct Dial: [phone]  Fax: [phone] Website: [email or web address]

## 2024-09-25 NOTE — Progress Notes (Signed)
 Complex Care Management Note Care Guide Note  09/25/2024 Name: Frank Sullivan MRN: [id number] DOB: 18-Aug-1981   Complex Care Management Outreach Attempts: A second unsuccessful outreach was attempted today to offer the patient with information about available complex care management services.  Follow Up Plan:  Additional outreach attempts will be made to offer the patient complex care management information and services.   Encounter Outcome:  No Answer  [name], CMA, AAMA [place]  [facility], [facility] Guide, Lead Direct Dial: [phone]  Fax: [phone]

## 2024-09-26 NOTE — Progress Notes (Signed)
 Complex Care Management Note Care Guide Note  09/26/2024 Name: Frank Sullivan MRN: [id number] DOB: 1981-02-19   Complex Care Management Outreach Attempts: A third unsuccessful outreach was attempted today to offer the patient with information about available complex care management services.  Follow Up Plan:  No further outreach attempts will be made at this time. We have been unable to contact the patient to offer or enroll patient in complex care management services.  Encounter Outcome:  No Answer  [name], CMA, AAMA [place]  [facility], [facility] Guide, Lead Direct Dial: [phone]  Fax: [phone]
